# Patient Record
Sex: Female | Born: 1955 | Race: Black or African American | Hispanic: No | Marital: Single | Smoking: Never smoker
Health system: Southern US, Community
[De-identification: ages and names within clinical notes are randomized; demographics above are authoritative.]

## PROBLEM LIST (undated history)

## (undated) DIAGNOSIS — R51 Headache: Secondary | ICD-10-CM

## (undated) DIAGNOSIS — K219 Gastro-esophageal reflux disease without esophagitis: Secondary | ICD-10-CM

## (undated) DIAGNOSIS — F419 Anxiety disorder, unspecified: Secondary | ICD-10-CM

## (undated) DIAGNOSIS — E119 Type 2 diabetes mellitus without complications: Secondary | ICD-10-CM

## (undated) DIAGNOSIS — G4733 Obstructive sleep apnea (adult) (pediatric): Secondary | ICD-10-CM

## (undated) DIAGNOSIS — J45909 Unspecified asthma, uncomplicated: Secondary | ICD-10-CM

## (undated) DIAGNOSIS — R519 Headache, unspecified: Secondary | ICD-10-CM

## (undated) DIAGNOSIS — G8929 Other chronic pain: Secondary | ICD-10-CM

## (undated) DIAGNOSIS — D869 Sarcoidosis, unspecified: Secondary | ICD-10-CM

## (undated) DIAGNOSIS — K635 Polyp of colon: Secondary | ICD-10-CM

## (undated) DIAGNOSIS — E785 Hyperlipidemia, unspecified: Secondary | ICD-10-CM

## (undated) HISTORY — DX: Unspecified asthma, uncomplicated: J45.909

## (undated) HISTORY — DX: Headache, unspecified: R51.9

## (undated) HISTORY — DX: Type 2 diabetes mellitus without complications: E11.9

## (undated) HISTORY — DX: Gastro-esophageal reflux disease without esophagitis: K21.9

## (undated) HISTORY — DX: Other chronic pain: G89.29

## (undated) HISTORY — DX: Polyp of colon: K63.5

## (undated) HISTORY — DX: Sarcoidosis, unspecified: D86.9

## (undated) HISTORY — DX: Anxiety disorder, unspecified: F41.9

## (undated) HISTORY — PX: ABDOMINAL HYSTERECTOMY: SHX81

## (undated) HISTORY — DX: Obstructive sleep apnea (adult) (pediatric): G47.33

## (undated) HISTORY — DX: Hyperlipidemia, unspecified: E78.5

## (undated) HISTORY — DX: Headache: R51

---

## 1976-08-19 HISTORY — PX: TUBAL LIGATION: SHX77

## 1990-08-19 HISTORY — PX: VESICOVAGINAL FISTULA CLOSURE W/ TAH: SUR271

## 1995-08-20 HISTORY — PX: CHOLECYSTECTOMY: SHX55

## 1996-08-19 HISTORY — PX: LUNG SURGERY: SHX703

## 2011-08-28 DIAGNOSIS — J45909 Unspecified asthma, uncomplicated: Secondary | ICD-10-CM | POA: Diagnosis not present

## 2011-08-28 DIAGNOSIS — G4733 Obstructive sleep apnea (adult) (pediatric): Secondary | ICD-10-CM | POA: Diagnosis not present

## 2011-08-28 DIAGNOSIS — J99 Respiratory disorders in diseases classified elsewhere: Secondary | ICD-10-CM | POA: Diagnosis not present

## 2011-09-02 DIAGNOSIS — E119 Type 2 diabetes mellitus without complications: Secondary | ICD-10-CM | POA: Diagnosis not present

## 2011-09-02 DIAGNOSIS — J329 Chronic sinusitis, unspecified: Secondary | ICD-10-CM | POA: Diagnosis not present

## 2011-09-02 DIAGNOSIS — I10 Essential (primary) hypertension: Secondary | ICD-10-CM | POA: Diagnosis not present

## 2011-09-17 DIAGNOSIS — R498 Other voice and resonance disorders: Secondary | ICD-10-CM | POA: Diagnosis not present

## 2011-09-17 DIAGNOSIS — H9209 Otalgia, unspecified ear: Secondary | ICD-10-CM | POA: Diagnosis not present

## 2011-10-09 DIAGNOSIS — E785 Hyperlipidemia, unspecified: Secondary | ICD-10-CM | POA: Diagnosis not present

## 2011-10-09 DIAGNOSIS — E119 Type 2 diabetes mellitus without complications: Secondary | ICD-10-CM | POA: Diagnosis not present

## 2011-12-04 DIAGNOSIS — G5600 Carpal tunnel syndrome, unspecified upper limb: Secondary | ICD-10-CM | POA: Diagnosis not present

## 2011-12-04 DIAGNOSIS — M129 Arthropathy, unspecified: Secondary | ICD-10-CM | POA: Diagnosis not present

## 2011-12-04 DIAGNOSIS — M79609 Pain in unspecified limb: Secondary | ICD-10-CM | POA: Diagnosis not present

## 2011-12-04 DIAGNOSIS — E119 Type 2 diabetes mellitus without complications: Secondary | ICD-10-CM | POA: Diagnosis not present

## 2011-12-10 DIAGNOSIS — M722 Plantar fascial fibromatosis: Secondary | ICD-10-CM | POA: Diagnosis not present

## 2011-12-17 DIAGNOSIS — M722 Plantar fascial fibromatosis: Secondary | ICD-10-CM | POA: Diagnosis not present

## 2011-12-25 DIAGNOSIS — E119 Type 2 diabetes mellitus without complications: Secondary | ICD-10-CM | POA: Diagnosis not present

## 2011-12-28 DIAGNOSIS — IMO0002 Reserved for concepts with insufficient information to code with codable children: Secondary | ICD-10-CM | POA: Diagnosis not present

## 2011-12-28 DIAGNOSIS — M4310 Spondylolisthesis, site unspecified: Secondary | ICD-10-CM | POA: Diagnosis not present

## 2011-12-28 DIAGNOSIS — M542 Cervicalgia: Secondary | ICD-10-CM | POA: Diagnosis not present

## 2011-12-28 DIAGNOSIS — M62838 Other muscle spasm: Secondary | ICD-10-CM | POA: Diagnosis not present

## 2011-12-28 DIAGNOSIS — M5030 Other cervical disc degeneration, unspecified cervical region: Secondary | ICD-10-CM | POA: Diagnosis not present

## 2011-12-28 DIAGNOSIS — M47812 Spondylosis without myelopathy or radiculopathy, cervical region: Secondary | ICD-10-CM | POA: Diagnosis not present

## 2011-12-28 DIAGNOSIS — G8911 Acute pain due to trauma: Secondary | ICD-10-CM | POA: Diagnosis not present

## 2011-12-28 DIAGNOSIS — M25519 Pain in unspecified shoulder: Secondary | ICD-10-CM | POA: Diagnosis not present

## 2011-12-31 DIAGNOSIS — R5383 Other fatigue: Secondary | ICD-10-CM | POA: Diagnosis not present

## 2011-12-31 DIAGNOSIS — E119 Type 2 diabetes mellitus without complications: Secondary | ICD-10-CM | POA: Diagnosis not present

## 2011-12-31 DIAGNOSIS — I10 Essential (primary) hypertension: Secondary | ICD-10-CM | POA: Diagnosis not present

## 2011-12-31 DIAGNOSIS — M5030 Other cervical disc degeneration, unspecified cervical region: Secondary | ICD-10-CM | POA: Diagnosis not present

## 2012-01-30 DIAGNOSIS — I10 Essential (primary) hypertension: Secondary | ICD-10-CM | POA: Diagnosis not present

## 2012-01-30 DIAGNOSIS — M5030 Other cervical disc degeneration, unspecified cervical region: Secondary | ICD-10-CM | POA: Diagnosis not present

## 2012-01-30 DIAGNOSIS — F329 Major depressive disorder, single episode, unspecified: Secondary | ICD-10-CM | POA: Diagnosis not present

## 2012-01-30 DIAGNOSIS — F3289 Other specified depressive episodes: Secondary | ICD-10-CM | POA: Diagnosis not present

## 2012-01-30 DIAGNOSIS — E119 Type 2 diabetes mellitus without complications: Secondary | ICD-10-CM | POA: Diagnosis not present

## 2012-02-13 DIAGNOSIS — E119 Type 2 diabetes mellitus without complications: Secondary | ICD-10-CM | POA: Diagnosis not present

## 2012-03-03 DIAGNOSIS — G4733 Obstructive sleep apnea (adult) (pediatric): Secondary | ICD-10-CM | POA: Diagnosis not present

## 2012-03-03 DIAGNOSIS — J45909 Unspecified asthma, uncomplicated: Secondary | ICD-10-CM | POA: Diagnosis not present

## 2012-03-03 DIAGNOSIS — J99 Respiratory disorders in diseases classified elsewhere: Secondary | ICD-10-CM | POA: Diagnosis not present

## 2012-03-03 DIAGNOSIS — D869 Sarcoidosis, unspecified: Secondary | ICD-10-CM | POA: Diagnosis not present

## 2012-03-16 DIAGNOSIS — B356 Tinea cruris: Secondary | ICD-10-CM | POA: Diagnosis not present

## 2012-03-16 DIAGNOSIS — I10 Essential (primary) hypertension: Secondary | ICD-10-CM | POA: Diagnosis not present

## 2012-03-16 DIAGNOSIS — M5030 Other cervical disc degeneration, unspecified cervical region: Secondary | ICD-10-CM | POA: Diagnosis not present

## 2012-03-16 DIAGNOSIS — E119 Type 2 diabetes mellitus without complications: Secondary | ICD-10-CM | POA: Diagnosis not present

## 2012-04-06 DIAGNOSIS — Z1231 Encounter for screening mammogram for malignant neoplasm of breast: Secondary | ICD-10-CM | POA: Diagnosis not present

## 2012-04-29 DIAGNOSIS — L821 Other seborrheic keratosis: Secondary | ICD-10-CM | POA: Diagnosis not present

## 2012-04-29 DIAGNOSIS — L909 Atrophic disorder of skin, unspecified: Secondary | ICD-10-CM | POA: Diagnosis not present

## 2012-04-29 DIAGNOSIS — L919 Hypertrophic disorder of the skin, unspecified: Secondary | ICD-10-CM | POA: Diagnosis not present

## 2012-05-06 DIAGNOSIS — H9209 Otalgia, unspecified ear: Secondary | ICD-10-CM | POA: Diagnosis not present

## 2012-05-18 DIAGNOSIS — I10 Essential (primary) hypertension: Secondary | ICD-10-CM | POA: Diagnosis not present

## 2012-05-18 DIAGNOSIS — M545 Low back pain: Secondary | ICD-10-CM | POA: Diagnosis not present

## 2012-05-18 DIAGNOSIS — J329 Chronic sinusitis, unspecified: Secondary | ICD-10-CM | POA: Diagnosis not present

## 2012-05-18 DIAGNOSIS — E119 Type 2 diabetes mellitus without complications: Secondary | ICD-10-CM | POA: Diagnosis not present

## 2012-05-20 DIAGNOSIS — R221 Localized swelling, mass and lump, neck: Secondary | ICD-10-CM | POA: Diagnosis not present

## 2012-05-20 DIAGNOSIS — R220 Localized swelling, mass and lump, head: Secondary | ICD-10-CM | POA: Diagnosis not present

## 2012-05-20 DIAGNOSIS — M542 Cervicalgia: Secondary | ICD-10-CM | POA: Diagnosis not present

## 2012-05-25 DIAGNOSIS — L0201 Cutaneous abscess of face: Secondary | ICD-10-CM | POA: Diagnosis not present

## 2012-05-25 DIAGNOSIS — R109 Unspecified abdominal pain: Secondary | ICD-10-CM | POA: Diagnosis not present

## 2012-05-25 DIAGNOSIS — E119 Type 2 diabetes mellitus without complications: Secondary | ICD-10-CM | POA: Diagnosis not present

## 2012-05-25 DIAGNOSIS — L03211 Cellulitis of face: Secondary | ICD-10-CM | POA: Diagnosis not present

## 2012-05-25 DIAGNOSIS — I10 Essential (primary) hypertension: Secondary | ICD-10-CM | POA: Diagnosis not present

## 2012-05-25 DIAGNOSIS — R5383 Other fatigue: Secondary | ICD-10-CM | POA: Diagnosis not present

## 2012-05-25 DIAGNOSIS — R5381 Other malaise: Secondary | ICD-10-CM | POA: Diagnosis not present

## 2012-05-25 DIAGNOSIS — H9209 Otalgia, unspecified ear: Secondary | ICD-10-CM | POA: Diagnosis not present

## 2012-07-06 DIAGNOSIS — M542 Cervicalgia: Secondary | ICD-10-CM | POA: Diagnosis not present

## 2012-07-06 DIAGNOSIS — M436 Torticollis: Secondary | ICD-10-CM | POA: Diagnosis not present

## 2012-07-07 DIAGNOSIS — J45909 Unspecified asthma, uncomplicated: Secondary | ICD-10-CM | POA: Diagnosis not present

## 2012-07-07 DIAGNOSIS — M542 Cervicalgia: Secondary | ICD-10-CM | POA: Diagnosis not present

## 2012-07-07 DIAGNOSIS — E119 Type 2 diabetes mellitus without complications: Secondary | ICD-10-CM | POA: Diagnosis not present

## 2012-07-07 DIAGNOSIS — D869 Sarcoidosis, unspecified: Secondary | ICD-10-CM | POA: Diagnosis not present

## 2012-07-07 DIAGNOSIS — Z794 Long term (current) use of insulin: Secondary | ICD-10-CM | POA: Diagnosis not present

## 2012-07-07 DIAGNOSIS — S139XXA Sprain of joints and ligaments of unspecified parts of neck, initial encounter: Secondary | ICD-10-CM | POA: Diagnosis not present

## 2012-07-07 DIAGNOSIS — I10 Essential (primary) hypertension: Secondary | ICD-10-CM | POA: Diagnosis not present

## 2012-07-07 DIAGNOSIS — Z7982 Long term (current) use of aspirin: Secondary | ICD-10-CM | POA: Diagnosis not present

## 2012-07-23 DIAGNOSIS — I10 Essential (primary) hypertension: Secondary | ICD-10-CM | POA: Diagnosis not present

## 2012-07-23 DIAGNOSIS — R5381 Other malaise: Secondary | ICD-10-CM | POA: Diagnosis not present

## 2012-07-23 DIAGNOSIS — R5383 Other fatigue: Secondary | ICD-10-CM | POA: Diagnosis not present

## 2012-07-23 DIAGNOSIS — D869 Sarcoidosis, unspecified: Secondary | ICD-10-CM | POA: Diagnosis not present

## 2012-07-23 DIAGNOSIS — M5030 Other cervical disc degeneration, unspecified cervical region: Secondary | ICD-10-CM | POA: Diagnosis not present

## 2012-09-11 DIAGNOSIS — IMO0002 Reserved for concepts with insufficient information to code with codable children: Secondary | ICD-10-CM | POA: Diagnosis not present

## 2012-09-24 DIAGNOSIS — R1012 Left upper quadrant pain: Secondary | ICD-10-CM | POA: Diagnosis not present

## 2012-09-24 DIAGNOSIS — E119 Type 2 diabetes mellitus without complications: Secondary | ICD-10-CM | POA: Diagnosis not present

## 2012-09-24 DIAGNOSIS — R5383 Other fatigue: Secondary | ICD-10-CM | POA: Diagnosis not present

## 2012-09-24 DIAGNOSIS — E782 Mixed hyperlipidemia: Secondary | ICD-10-CM | POA: Diagnosis not present

## 2012-09-24 DIAGNOSIS — R5381 Other malaise: Secondary | ICD-10-CM | POA: Diagnosis not present

## 2012-09-24 DIAGNOSIS — M542 Cervicalgia: Secondary | ICD-10-CM | POA: Diagnosis not present

## 2012-10-21 DIAGNOSIS — Z1211 Encounter for screening for malignant neoplasm of colon: Secondary | ICD-10-CM | POA: Diagnosis not present

## 2012-10-21 DIAGNOSIS — Z8371 Family history of colonic polyps: Secondary | ICD-10-CM | POA: Diagnosis not present

## 2012-10-22 DIAGNOSIS — R1013 Epigastric pain: Secondary | ICD-10-CM | POA: Diagnosis not present

## 2012-10-22 DIAGNOSIS — K2980 Duodenitis without bleeding: Secondary | ICD-10-CM | POA: Diagnosis not present

## 2012-10-22 DIAGNOSIS — K2900 Acute gastritis without bleeding: Secondary | ICD-10-CM | POA: Diagnosis not present

## 2012-10-23 DIAGNOSIS — R5381 Other malaise: Secondary | ICD-10-CM | POA: Diagnosis not present

## 2012-10-23 DIAGNOSIS — K5732 Diverticulitis of large intestine without perforation or abscess without bleeding: Secondary | ICD-10-CM | POA: Diagnosis not present

## 2012-10-23 DIAGNOSIS — R300 Dysuria: Secondary | ICD-10-CM | POA: Diagnosis not present

## 2012-11-13 DIAGNOSIS — R5381 Other malaise: Secondary | ICD-10-CM | POA: Diagnosis not present

## 2012-11-13 DIAGNOSIS — S93409A Sprain of unspecified ligament of unspecified ankle, initial encounter: Secondary | ICD-10-CM | POA: Diagnosis not present

## 2012-11-13 DIAGNOSIS — E119 Type 2 diabetes mellitus without complications: Secondary | ICD-10-CM | POA: Diagnosis not present

## 2012-11-13 DIAGNOSIS — K219 Gastro-esophageal reflux disease without esophagitis: Secondary | ICD-10-CM | POA: Diagnosis not present

## 2012-12-07 DIAGNOSIS — M2550 Pain in unspecified joint: Secondary | ICD-10-CM | POA: Diagnosis not present

## 2012-12-07 DIAGNOSIS — B370 Candidal stomatitis: Secondary | ICD-10-CM | POA: Diagnosis not present

## 2012-12-07 DIAGNOSIS — L989 Disorder of the skin and subcutaneous tissue, unspecified: Secondary | ICD-10-CM | POA: Diagnosis not present

## 2012-12-07 DIAGNOSIS — N390 Urinary tract infection, site not specified: Secondary | ICD-10-CM | POA: Diagnosis not present

## 2012-12-09 DIAGNOSIS — Z794 Long term (current) use of insulin: Secondary | ICD-10-CM | POA: Diagnosis not present

## 2012-12-09 DIAGNOSIS — R05 Cough: Secondary | ICD-10-CM | POA: Diagnosis not present

## 2012-12-09 DIAGNOSIS — R059 Cough, unspecified: Secondary | ICD-10-CM | POA: Diagnosis not present

## 2012-12-09 DIAGNOSIS — N8111 Cystocele, midline: Secondary | ICD-10-CM | POA: Diagnosis not present

## 2012-12-09 DIAGNOSIS — Z79899 Other long term (current) drug therapy: Secondary | ICD-10-CM | POA: Diagnosis not present

## 2012-12-09 DIAGNOSIS — E119 Type 2 diabetes mellitus without complications: Secondary | ICD-10-CM | POA: Diagnosis not present

## 2012-12-09 DIAGNOSIS — M545 Low back pain: Secondary | ICD-10-CM | POA: Diagnosis not present

## 2012-12-09 DIAGNOSIS — N390 Urinary tract infection, site not specified: Secondary | ICD-10-CM | POA: Diagnosis not present

## 2012-12-09 DIAGNOSIS — R32 Unspecified urinary incontinence: Secondary | ICD-10-CM | POA: Diagnosis not present

## 2012-12-11 DIAGNOSIS — M549 Dorsalgia, unspecified: Secondary | ICD-10-CM | POA: Diagnosis not present

## 2012-12-11 DIAGNOSIS — R300 Dysuria: Secondary | ICD-10-CM | POA: Diagnosis not present

## 2012-12-11 DIAGNOSIS — N8111 Cystocele, midline: Secondary | ICD-10-CM | POA: Diagnosis not present

## 2012-12-11 DIAGNOSIS — N3946 Mixed incontinence: Secondary | ICD-10-CM | POA: Diagnosis not present

## 2012-12-21 DIAGNOSIS — N390 Urinary tract infection, site not specified: Secondary | ICD-10-CM | POA: Diagnosis not present

## 2012-12-21 DIAGNOSIS — N393 Stress incontinence (female) (male): Secondary | ICD-10-CM | POA: Diagnosis not present

## 2012-12-28 DIAGNOSIS — R059 Cough, unspecified: Secondary | ICD-10-CM | POA: Diagnosis not present

## 2012-12-28 DIAGNOSIS — Z111 Encounter for screening for respiratory tuberculosis: Secondary | ICD-10-CM | POA: Diagnosis not present

## 2012-12-28 DIAGNOSIS — R05 Cough: Secondary | ICD-10-CM | POA: Diagnosis not present

## 2012-12-28 DIAGNOSIS — N390 Urinary tract infection, site not specified: Secondary | ICD-10-CM | POA: Diagnosis not present

## 2012-12-28 DIAGNOSIS — E785 Hyperlipidemia, unspecified: Secondary | ICD-10-CM | POA: Diagnosis not present

## 2012-12-30 DIAGNOSIS — Z111 Encounter for screening for respiratory tuberculosis: Secondary | ICD-10-CM | POA: Diagnosis not present

## 2013-02-02 DIAGNOSIS — R05 Cough: Secondary | ICD-10-CM | POA: Diagnosis not present

## 2013-02-02 DIAGNOSIS — J45909 Unspecified asthma, uncomplicated: Secondary | ICD-10-CM | POA: Diagnosis not present

## 2013-02-02 DIAGNOSIS — G4733 Obstructive sleep apnea (adult) (pediatric): Secondary | ICD-10-CM | POA: Diagnosis not present

## 2013-02-02 DIAGNOSIS — J3089 Other allergic rhinitis: Secondary | ICD-10-CM | POA: Diagnosis not present

## 2013-02-10 DIAGNOSIS — R0789 Other chest pain: Secondary | ICD-10-CM | POA: Diagnosis not present

## 2013-02-10 DIAGNOSIS — D869 Sarcoidosis, unspecified: Secondary | ICD-10-CM | POA: Diagnosis not present

## 2013-02-10 DIAGNOSIS — E119 Type 2 diabetes mellitus without complications: Secondary | ICD-10-CM | POA: Diagnosis not present

## 2013-02-10 DIAGNOSIS — I10 Essential (primary) hypertension: Secondary | ICD-10-CM | POA: Diagnosis not present

## 2013-02-23 DIAGNOSIS — D649 Anemia, unspecified: Secondary | ICD-10-CM | POA: Diagnosis not present

## 2013-03-04 DIAGNOSIS — K219 Gastro-esophageal reflux disease without esophagitis: Secondary | ICD-10-CM | POA: Diagnosis not present

## 2013-03-04 DIAGNOSIS — J45909 Unspecified asthma, uncomplicated: Secondary | ICD-10-CM | POA: Diagnosis not present

## 2013-03-04 DIAGNOSIS — G4733 Obstructive sleep apnea (adult) (pediatric): Secondary | ICD-10-CM | POA: Diagnosis not present

## 2013-03-04 DIAGNOSIS — R05 Cough: Secondary | ICD-10-CM | POA: Diagnosis not present

## 2013-03-12 DIAGNOSIS — M545 Low back pain: Secondary | ICD-10-CM | POA: Diagnosis not present

## 2013-03-12 DIAGNOSIS — D869 Sarcoidosis, unspecified: Secondary | ICD-10-CM | POA: Diagnosis not present

## 2013-03-12 DIAGNOSIS — K5900 Constipation, unspecified: Secondary | ICD-10-CM | POA: Diagnosis not present

## 2013-03-12 DIAGNOSIS — E119 Type 2 diabetes mellitus without complications: Secondary | ICD-10-CM | POA: Diagnosis not present

## 2013-04-13 DIAGNOSIS — D869 Sarcoidosis, unspecified: Secondary | ICD-10-CM | POA: Diagnosis not present

## 2013-04-13 DIAGNOSIS — E119 Type 2 diabetes mellitus without complications: Secondary | ICD-10-CM | POA: Diagnosis not present

## 2013-04-13 DIAGNOSIS — M5030 Other cervical disc degeneration, unspecified cervical region: Secondary | ICD-10-CM | POA: Diagnosis not present

## 2013-04-13 DIAGNOSIS — I10 Essential (primary) hypertension: Secondary | ICD-10-CM | POA: Diagnosis not present

## 2013-04-13 DIAGNOSIS — H524 Presbyopia: Secondary | ICD-10-CM | POA: Diagnosis not present

## 2013-04-13 DIAGNOSIS — H30039 Focal chorioretinal inflammation, peripheral, unspecified eye: Secondary | ICD-10-CM | POA: Diagnosis not present

## 2013-05-10 DIAGNOSIS — M5380 Other specified dorsopathies, site unspecified: Secondary | ICD-10-CM | POA: Diagnosis not present

## 2013-05-10 DIAGNOSIS — R110 Nausea: Secondary | ICD-10-CM | POA: Diagnosis not present

## 2013-05-10 DIAGNOSIS — M5030 Other cervical disc degeneration, unspecified cervical region: Secondary | ICD-10-CM | POA: Diagnosis not present

## 2013-06-02 DIAGNOSIS — B379 Candidiasis, unspecified: Secondary | ICD-10-CM | POA: Diagnosis not present

## 2013-06-02 DIAGNOSIS — E1169 Type 2 diabetes mellitus with other specified complication: Secondary | ICD-10-CM | POA: Diagnosis not present

## 2013-06-11 DIAGNOSIS — I10 Essential (primary) hypertension: Secondary | ICD-10-CM | POA: Diagnosis not present

## 2013-06-11 DIAGNOSIS — L0291 Cutaneous abscess, unspecified: Secondary | ICD-10-CM | POA: Diagnosis not present

## 2013-06-11 DIAGNOSIS — E119 Type 2 diabetes mellitus without complications: Secondary | ICD-10-CM | POA: Diagnosis not present

## 2013-06-11 DIAGNOSIS — D869 Sarcoidosis, unspecified: Secondary | ICD-10-CM | POA: Diagnosis not present

## 2013-06-14 DIAGNOSIS — Z23 Encounter for immunization: Secondary | ICD-10-CM | POA: Diagnosis not present

## 2013-06-14 DIAGNOSIS — E785 Hyperlipidemia, unspecified: Secondary | ICD-10-CM | POA: Diagnosis not present

## 2013-06-14 DIAGNOSIS — E7800 Pure hypercholesterolemia, unspecified: Secondary | ICD-10-CM | POA: Diagnosis not present

## 2013-06-14 DIAGNOSIS — E119 Type 2 diabetes mellitus without complications: Secondary | ICD-10-CM | POA: Diagnosis not present

## 2013-06-14 DIAGNOSIS — I10 Essential (primary) hypertension: Secondary | ICD-10-CM | POA: Diagnosis not present

## 2013-06-17 DIAGNOSIS — Z1231 Encounter for screening mammogram for malignant neoplasm of breast: Secondary | ICD-10-CM | POA: Diagnosis not present

## 2013-06-28 DIAGNOSIS — L98499 Non-pressure chronic ulcer of skin of other sites with unspecified severity: Secondary | ICD-10-CM | POA: Diagnosis not present

## 2013-06-28 DIAGNOSIS — D869 Sarcoidosis, unspecified: Secondary | ICD-10-CM | POA: Diagnosis not present

## 2013-06-28 DIAGNOSIS — D492 Neoplasm of unspecified behavior of bone, soft tissue, and skin: Secondary | ICD-10-CM | POA: Diagnosis not present

## 2013-07-10 DIAGNOSIS — Z885 Allergy status to narcotic agent status: Secondary | ICD-10-CM | POA: Diagnosis not present

## 2013-07-10 DIAGNOSIS — D869 Sarcoidosis, unspecified: Secondary | ICD-10-CM | POA: Diagnosis not present

## 2013-07-10 DIAGNOSIS — E119 Type 2 diabetes mellitus without complications: Secondary | ICD-10-CM | POA: Diagnosis not present

## 2013-07-10 DIAGNOSIS — Z794 Long term (current) use of insulin: Secondary | ICD-10-CM | POA: Diagnosis not present

## 2013-07-10 DIAGNOSIS — L988 Other specified disorders of the skin and subcutaneous tissue: Secondary | ICD-10-CM | POA: Diagnosis not present

## 2013-07-10 DIAGNOSIS — Z4802 Encounter for removal of sutures: Secondary | ICD-10-CM | POA: Diagnosis not present

## 2013-07-10 DIAGNOSIS — M329 Systemic lupus erythematosus, unspecified: Secondary | ICD-10-CM | POA: Diagnosis not present

## 2013-07-10 DIAGNOSIS — Z7982 Long term (current) use of aspirin: Secondary | ICD-10-CM | POA: Diagnosis not present

## 2013-07-10 DIAGNOSIS — K219 Gastro-esophageal reflux disease without esophagitis: Secondary | ICD-10-CM | POA: Diagnosis not present

## 2013-07-10 DIAGNOSIS — J45909 Unspecified asthma, uncomplicated: Secondary | ICD-10-CM | POA: Diagnosis not present

## 2013-07-10 DIAGNOSIS — L989 Disorder of the skin and subcutaneous tissue, unspecified: Secondary | ICD-10-CM | POA: Diagnosis not present

## 2013-07-13 DIAGNOSIS — L98499 Non-pressure chronic ulcer of skin of other sites with unspecified severity: Secondary | ICD-10-CM | POA: Diagnosis not present

## 2013-07-13 DIAGNOSIS — L0889 Other specified local infections of the skin and subcutaneous tissue: Secondary | ICD-10-CM | POA: Diagnosis not present

## 2013-07-19 DIAGNOSIS — D509 Iron deficiency anemia, unspecified: Secondary | ICD-10-CM | POA: Diagnosis not present

## 2013-07-19 DIAGNOSIS — L989 Disorder of the skin and subcutaneous tissue, unspecified: Secondary | ICD-10-CM | POA: Diagnosis not present

## 2013-07-19 DIAGNOSIS — E119 Type 2 diabetes mellitus without complications: Secondary | ICD-10-CM | POA: Diagnosis not present

## 2013-07-19 DIAGNOSIS — M5030 Other cervical disc degeneration, unspecified cervical region: Secondary | ICD-10-CM | POA: Diagnosis not present

## 2013-08-10 DIAGNOSIS — L910 Hypertrophic scar: Secondary | ICD-10-CM | POA: Diagnosis not present

## 2013-08-10 DIAGNOSIS — L83 Acanthosis nigricans: Secondary | ICD-10-CM | POA: Diagnosis not present

## 2013-08-10 DIAGNOSIS — L819 Disorder of pigmentation, unspecified: Secondary | ICD-10-CM | POA: Diagnosis not present

## 2013-09-15 DIAGNOSIS — IMO0001 Reserved for inherently not codable concepts without codable children: Secondary | ICD-10-CM | POA: Diagnosis not present

## 2013-09-15 DIAGNOSIS — G609 Hereditary and idiopathic neuropathy, unspecified: Secondary | ICD-10-CM | POA: Diagnosis not present

## 2013-09-15 DIAGNOSIS — E119 Type 2 diabetes mellitus without complications: Secondary | ICD-10-CM | POA: Diagnosis not present

## 2013-09-15 DIAGNOSIS — E039 Hypothyroidism, unspecified: Secondary | ICD-10-CM | POA: Diagnosis not present

## 2013-09-15 DIAGNOSIS — E785 Hyperlipidemia, unspecified: Secondary | ICD-10-CM | POA: Diagnosis not present

## 2013-09-20 DIAGNOSIS — E119 Type 2 diabetes mellitus without complications: Secondary | ICD-10-CM | POA: Diagnosis not present

## 2013-09-20 DIAGNOSIS — D869 Sarcoidosis, unspecified: Secondary | ICD-10-CM | POA: Diagnosis not present

## 2013-09-21 DIAGNOSIS — E1065 Type 1 diabetes mellitus with hyperglycemia: Secondary | ICD-10-CM | POA: Diagnosis not present

## 2013-09-21 DIAGNOSIS — IMO0002 Reserved for concepts with insufficient information to code with codable children: Secondary | ICD-10-CM | POA: Diagnosis not present

## 2013-10-18 DIAGNOSIS — B379 Candidiasis, unspecified: Secondary | ICD-10-CM | POA: Diagnosis not present

## 2013-10-18 DIAGNOSIS — D869 Sarcoidosis, unspecified: Secondary | ICD-10-CM | POA: Diagnosis not present

## 2013-10-18 DIAGNOSIS — M5030 Other cervical disc degeneration, unspecified cervical region: Secondary | ICD-10-CM | POA: Diagnosis not present

## 2013-10-18 DIAGNOSIS — M6740 Ganglion, unspecified site: Secondary | ICD-10-CM | POA: Diagnosis not present

## 2013-10-22 DIAGNOSIS — K1120 Sialoadenitis, unspecified: Secondary | ICD-10-CM | POA: Diagnosis not present

## 2013-10-22 DIAGNOSIS — E119 Type 2 diabetes mellitus without complications: Secondary | ICD-10-CM | POA: Diagnosis not present

## 2013-10-22 DIAGNOSIS — J209 Acute bronchitis, unspecified: Secondary | ICD-10-CM | POA: Diagnosis not present

## 2013-10-22 DIAGNOSIS — D869 Sarcoidosis, unspecified: Secondary | ICD-10-CM | POA: Diagnosis not present

## 2013-11-18 DIAGNOSIS — D869 Sarcoidosis, unspecified: Secondary | ICD-10-CM | POA: Diagnosis not present

## 2013-11-18 DIAGNOSIS — F329 Major depressive disorder, single episode, unspecified: Secondary | ICD-10-CM | POA: Diagnosis not present

## 2013-11-18 DIAGNOSIS — L919 Hypertrophic disorder of the skin, unspecified: Secondary | ICD-10-CM | POA: Diagnosis not present

## 2013-11-18 DIAGNOSIS — L909 Atrophic disorder of skin, unspecified: Secondary | ICD-10-CM | POA: Diagnosis not present

## 2013-11-18 DIAGNOSIS — F3289 Other specified depressive episodes: Secondary | ICD-10-CM | POA: Diagnosis not present

## 2013-11-18 DIAGNOSIS — F4321 Adjustment disorder with depressed mood: Secondary | ICD-10-CM | POA: Diagnosis not present

## 2014-06-13 DIAGNOSIS — R569 Unspecified convulsions: Secondary | ICD-10-CM | POA: Diagnosis not present

## 2014-06-13 DIAGNOSIS — E784 Other hyperlipidemia: Secondary | ICD-10-CM | POA: Diagnosis not present

## 2014-06-13 DIAGNOSIS — D869 Sarcoidosis, unspecified: Secondary | ICD-10-CM | POA: Diagnosis not present

## 2014-06-13 DIAGNOSIS — E1165 Type 2 diabetes mellitus with hyperglycemia: Secondary | ICD-10-CM | POA: Diagnosis not present

## 2014-07-25 DIAGNOSIS — R569 Unspecified convulsions: Secondary | ICD-10-CM | POA: Diagnosis not present

## 2014-07-25 DIAGNOSIS — I10 Essential (primary) hypertension: Secondary | ICD-10-CM | POA: Diagnosis not present

## 2014-07-25 DIAGNOSIS — D869 Sarcoidosis, unspecified: Secondary | ICD-10-CM | POA: Diagnosis not present

## 2014-07-25 DIAGNOSIS — E784 Other hyperlipidemia: Secondary | ICD-10-CM | POA: Diagnosis not present

## 2014-07-25 DIAGNOSIS — E1165 Type 2 diabetes mellitus with hyperglycemia: Secondary | ICD-10-CM | POA: Diagnosis not present

## 2014-07-28 ENCOUNTER — Institutional Professional Consult (permissible substitution): Payer: Self-pay | Admitting: Internal Medicine

## 2014-08-08 ENCOUNTER — Encounter: Payer: Self-pay | Admitting: Internal Medicine

## 2014-08-08 ENCOUNTER — Ambulatory Visit (INDEPENDENT_AMBULATORY_CARE_PROVIDER_SITE_OTHER): Payer: Medicare Other | Admitting: Internal Medicine

## 2014-08-08 ENCOUNTER — Ambulatory Visit (INDEPENDENT_AMBULATORY_CARE_PROVIDER_SITE_OTHER)
Admission: RE | Admit: 2014-08-08 | Discharge: 2014-08-08 | Disposition: A | Discharge status: Home / Self Care | Payer: Medicare Other | Source: Ambulatory Visit | Attending: Internal Medicine | Admitting: Internal Medicine

## 2014-08-08 ENCOUNTER — Encounter (INDEPENDENT_AMBULATORY_CARE_PROVIDER_SITE_OTHER): Payer: Self-pay

## 2014-08-08 VITALS — BP 122/70 | HR 92 | Ht 62.5 in | Wt 225.0 lb

## 2014-08-08 DIAGNOSIS — R05 Cough: Secondary | ICD-10-CM

## 2014-08-08 DIAGNOSIS — D860 Sarcoidosis of lung: Secondary | ICD-10-CM | POA: Diagnosis not present

## 2014-08-08 DIAGNOSIS — R918 Other nonspecific abnormal finding of lung field: Secondary | ICD-10-CM | POA: Diagnosis not present

## 2014-08-08 DIAGNOSIS — D869 Sarcoidosis, unspecified: Secondary | ICD-10-CM | POA: Diagnosis not present

## 2014-08-08 DIAGNOSIS — R058 Other specified cough: Secondary | ICD-10-CM

## 2014-08-08 DIAGNOSIS — I10 Essential (primary) hypertension: Secondary | ICD-10-CM | POA: Diagnosis not present

## 2014-08-08 MED ORDER — VALSARTAN 160 MG PO TABS: 160.0000 mg | ORAL_TABLET | Freq: Every day | ORAL | Status: DC

## 2014-08-08 MED ORDER — ALBUTEROL SULFATE (2.5 MG/3ML) 0.083% IN NEBU: 2.5000 mg | INHALATION_SOLUTION | Freq: Four times a day (QID) | RESPIRATORY_TRACT | Status: DC | PRN

## 2014-08-08 NOTE — Progress Notes (Signed)
Subjective:    Patient ID: Cathy Stevenson, female    DOB: 12/30/1955,    MRN: [id number]  HPI  23 yobf dx sarcoid in 1980s presenting as cough/sob  requiring "chemo" at one point in the 90s (cytoxan)  and last prednisone 2014 helps but drives up sugar so referred by Avbuerre to pulmonary clinic 08/08/2014 for refractory cough ? Sarcoid related.    08/08/2014 1st [facility] office visit/ Dannell Raczkowski  / on ACEi for ? Decades  Chief Complaint  Patient presents with  . Pulmonary Consult    Referred by Dr. Haskel Schroeder. Pt states that she was dxed with Sarcoid 32 yrs ago  by lung bx. She c/o cough and chest discomfort that has bothered her since she was dxed. Her cough is prod with minimal yellow sputum.  She also c/o DOE for the past 2 wks- with walking short distances and lifting things.   cough is chronic x years, more day than night, and when severe assoc with doe x more than room to room with audible wheezing/ sensation of something stuck in her throat but actually very little mucus production, saba not very helpful  No obvious   patterns in day to day or daytime variabilty or assoc  chest tightness, subjective wheeze overt sinus or hb symptoms. No unusual exp hx or h/o childhood pna/ asthma or knowledge of premature birth.  Sleeping ok without nocturnal  or early am exacerbation  of respiratory  c/o's or need for noct saba. Also denies any obvious fluctuation of symptoms with weather or environmental changes or other aggravating or alleviating factors except as outlined above   Current Medications, Allergies, Complete Past Medical History, Past Surgical History, Family History, and Social History were reviewed in [employer] record.                Review of Systems  Constitutional: Negative for fever, chills and unexpected weight change.  HENT: Negative for congestion, dental problem, ear pain, nosebleeds, postnasal drip, rhinorrhea, sinus pressure,  sneezing, sore throat, trouble swallowing and voice change.   Eyes: Negative for visual disturbance.  Respiratory: Positive for cough and shortness of breath. Negative for choking.   Cardiovascular: Positive for chest pain. Negative for leg swelling.  Gastrointestinal: Negative for vomiting, abdominal pain and diarrhea.  Genitourinary: Negative for difficulty urinating.  Musculoskeletal: Negative for arthralgias.  Skin: Negative for rash.  Neurological: Positive for headaches. Negative for tremors and syncope.  Hematological: Does not bruise/bleed easily.       Objective:   Physical Exam  amb obese bf nad except for mild pseudowheeze  Wt Readings from Last 3 Encounters:  08/08/14 225 lb (102.059 kg)    Vital signs reviewed  HEENT: nl dentition, turbinates, and orophanx. Nl external ear canals without cough reflex   NECK :  without JVD/Nodes/TM/ nl carotid upstrokes bilaterally   LUNGS: no acc muscle use, clear to A and P bilaterally without cough on insp or exp maneuvers   CV:  RRR  no s3 or murmur or increase in P2, no edema   ABD:  soft and nontender with nl excursion in the supine position. No bruits or organomegaly, bowel sounds nl  MS:  warm without deformities, calf tenderness, cyanosis or clubbing  SKIN: warm and dry without lesions    NEURO:  alert, approp, no deficits    cxr 08/08/14 . Cannot exclude mild right hilar and infrahilar adenopathy.  2. Diffuse mild interstitial prominence with mild right lower  lobe infiltrate. These changes could all be related to the patient's sarcoidosis. Active infection pneumonitis and right lower lobe pneumonia cannot be excluded.       Assessment & Plan:

## 2014-08-08 NOTE — Patient Instructions (Addendum)
Stop ramapril and your symptoms should gradually improve over several weeks  Start diovan 160 mg one daily   Ok to use the nebulizer if needed up to 4 hours > breathing/ chest tightness/ asthma/ wheezing (it will last 4 hours)   Change nexium where you take it Take 30- 60 min before your first and last meals of the day  GERD (REFLUX)  is an extremely common cause of respiratory symptoms just like yours , many times with no obvious heartburn at all.    It can be treated with medication, but also with lifestyle changes including avoidance of late meals, excessive alcohol, smoking cessation, and avoid fatty foods, chocolate, peppermint, colas, red wine, and acidic juices such as orange juice.  NO MINT OR MENTHOL PRODUCTS SO NO COUGH DROPS  USE SUGARLESS CANDY INSTEAD (Jolley ranchers or Stover's or Life Savers) or even ice chips will also do - the key is to swallow to prevent all throat clearing. NO OIL BASED VITAMINS - use powdered substitutes.   Please remember to go to the x-ray department downstairs for your tests - we will call you with the results when they are available.     Please schedule a follow up office visit in 4 weeks, sooner if needed wih pfts

## 2014-08-08 NOTE — Progress Notes (Signed)
Quick Note:  ATC, NA and no option to leave msg, WCB ______ 

## 2014-08-09 ENCOUNTER — Encounter: Payer: Self-pay | Admitting: Internal Medicine

## 2014-08-09 DIAGNOSIS — R05 Cough: Secondary | ICD-10-CM | POA: Insufficient documentation

## 2014-08-09 DIAGNOSIS — I10 Essential (primary) hypertension: Secondary | ICD-10-CM | POA: Insufficient documentation

## 2014-08-09 DIAGNOSIS — R058 Other specified cough: Secondary | ICD-10-CM | POA: Insufficient documentation

## 2014-08-09 NOTE — Assessment & Plan Note (Signed)
ACE inhibitors are problematic in  pts with airway complaints because  even experienced pulmonologists can't always distinguish ace effects from copd/asthma.  By themselves they don't actually cause a problem, much like oxygen can't by itself start a fire, but they certainly serve as a powerful catalyst or enhancer for any "fire"  or inflammatory process in the upper airway, be it caused by an ET  tube or more commonly reflux (especially in the obese or pts with known GERD or who are on biphoshonates).    In the era of ARB near equivalency until we have a better handle on the reversibility of the airway problem, it just makes sense to avoid ACEI  entirely in the short run and then decide later, having established a level of airway control using a reasonable limited regimen, whether to add back ace but even then being very careful to observe the pt for worsening airway control and number of meds used/ needed to control symptoms.    For now try diovan 160 mg daily - it's the only way to tell what role acei is having here

## 2014-08-09 NOTE — Assessment & Plan Note (Signed)
No evidence at all for dz activity at present

## 2014-08-09 NOTE — Assessment & Plan Note (Addendum)
The most common causes of chronic cough in immunocompetent adults include the following: upper airway cough syndrome (UACS), previously referred to as postnasal drip syndrome (PNDS), which is caused by variety of rhinosinus conditions; (2) asthma; (3) GERD; (4) chronic bronchitis from cigarette smoking or other inhaled environmental irritants; (5) nonasthmatic eosinophilic bronchitis; and (6) bronchiectasis.   These conditions, singly or in combination, have accounted for up to 94% of the causes of chronic cough in prospective studies.   Other conditions have constituted no >6% of the causes in prospective studies These have included bronchogenic carcinoma, chronic interstitial pneumonia, sarcoidosis, left ventricular failure, ACEI-induced cough, and aspiration from a condition associated with pharyngeal dysfunction.    Chronic cough is often simultaneously caused by more than one condition. A single cause has been found from 38 to 82% of the time, multiple causes from 18 to 62%. Multiply caused cough has been the result of three diseases up to 42% of the time.       Based on hx and exam, this is most likely:  Classic Upper airway cough syndrome, so named because it's frequently impossible to sort out how much is  CR/sinusitis with freq throat clearing (which can be related to primary GERD)   vs  causing  secondary (" extra esophageal")  GERD from wide swings in gastric pressure that occur with throat clearing, often  promoting self use of mint and menthol lozenges that reduce the lower esophageal sphincter tone and exacerbate the problem further in a cyclical fashion.   These are the same pts (now being labeled as having "irritable larynx syndrome" by some cough centers) who not infrequently have a history of having failed to tolerate ace inhibitors,  dry powder inhalers or biphosphonates or report having atypical reflux symptoms that don't respond to standard doses of PPI , and are easily confused as  having aecopd or asthma flares by even experienced allergists/ pulmonologists.   The first step is to stop acei and in short run  maximize acid suppression and eliminate cyclical coughing then regroup in 4 week with pfts   See instructions for specific recommendations which were reviewed directly with the patient who was given a copy with highlighter outlining the key components.

## 2014-08-09 NOTE — Progress Notes (Signed)
Quick Note:  Spoke with pt and notified of results per Dr. Wert. Pt verbalized understanding and denied any questions.  ______ 

## 2014-09-07 ENCOUNTER — Other Ambulatory Visit: Payer: Self-pay | Admitting: Internal Medicine

## 2014-09-07 DIAGNOSIS — R0600 Dyspnea, unspecified: Secondary | ICD-10-CM

## 2014-09-08 ENCOUNTER — Ambulatory Visit: Payer: Medicare Other | Admitting: Internal Medicine

## 2014-09-21 DIAGNOSIS — Z862 Personal history of diseases of the blood and blood-forming organs and certain disorders involving the immune mechanism: Secondary | ICD-10-CM | POA: Diagnosis not present

## 2014-09-21 DIAGNOSIS — H2513 Age-related nuclear cataract, bilateral: Secondary | ICD-10-CM | POA: Diagnosis not present

## 2014-09-21 DIAGNOSIS — E119 Type 2 diabetes mellitus without complications: Secondary | ICD-10-CM | POA: Diagnosis not present

## 2014-09-30 ENCOUNTER — Encounter: Payer: Self-pay | Admitting: *Deleted

## 2014-09-30 ENCOUNTER — Ambulatory Visit (INDEPENDENT_AMBULATORY_CARE_PROVIDER_SITE_OTHER): Payer: Medicare Other | Admitting: Internal Medicine

## 2014-09-30 ENCOUNTER — Encounter: Payer: Self-pay | Admitting: Internal Medicine

## 2014-09-30 VITALS — BP 140/60 | HR 110 | Ht 62.5 in | Wt 222.0 lb

## 2014-09-30 DIAGNOSIS — I10 Essential (primary) hypertension: Secondary | ICD-10-CM | POA: Diagnosis not present

## 2014-09-30 DIAGNOSIS — D869 Sarcoidosis, unspecified: Secondary | ICD-10-CM

## 2014-09-30 DIAGNOSIS — R05 Cough: Secondary | ICD-10-CM | POA: Diagnosis not present

## 2014-09-30 DIAGNOSIS — R058 Other specified cough: Secondary | ICD-10-CM

## 2014-09-30 DIAGNOSIS — R0600 Dyspnea, unspecified: Secondary | ICD-10-CM

## 2014-09-30 LAB — PULMONARY FUNCTION TEST
DL/VA % PRED: 101 %
DL/VA: 4.66 ml/min/mmHg/L
DLCO UNC % PRED: 65 %
DLCO unc: 14.48 ml/min/mmHg
FEF 25-75 Post: 1.47 L/sec
FEF 25-75 Pre: 1.32 L/sec
FEF2575-%Change-Post: 11 %
FEF2575-%PRED-PRE: 68 %
FEF2575-%Pred-Post: 76 %
FEV1-%Change-Post: 0 %
FEV1-%Pred-Post: 83 %
FEV1-%Pred-Pre: 84 %
FEV1-Post: 1.62 L
FEV1-Pre: 1.63 L
FEV1FVC-%Change-Post: 4 %
FEV1FVC-%Pred-Pre: 100 %
FEV6-%CHANGE-POST: -5 %
FEV6-%PRED-PRE: 86 %
FEV6-%Pred-Post: 81 %
FEV6-Post: 1.93 L
FEV6-Pre: 2.05 L
FEV6FVC-%PRED-POST: 104 %
FEV6FVC-%Pred-Pre: 104 %
FVC-%CHANGE-POST: -5 %
FVC-%PRED-POST: 78 %
FVC-%Pred-Pre: 82 %
FVC-Post: 1.94 L
FVC-Pre: 2.05 L
PRE FEV1/FVC RATIO: 80 %
Post FEV1/FVC ratio: 83 %
Post FEV6/FVC ratio: 100 %
Pre FEV6/FVC Ratio: 100 %

## 2014-09-30 MED ORDER — FUROSEMIDE 40 MG PO TABS: 40.0000 mg | ORAL_TABLET | Freq: Every day | ORAL | Status: AC | PRN

## 2014-09-30 MED ORDER — GABAPENTIN 300 MG PO CAPS: 300.0000 mg | ORAL_CAPSULE | Freq: Three times a day (TID) | ORAL | Status: DC

## 2014-09-30 NOTE — Progress Notes (Signed)
PFT performed today. 

## 2014-09-30 NOTE — Progress Notes (Signed)
Subjective:    Patient ID: Cathy Stevenson, female    DOB: [birth year],    MRN: [id number]    Brief patient profile:  89 yobf dx sarcoid in 1980s presenting as cough/sob  requiring "chemo" at one point in the 90s (cytoxan)  and last prednisone 2014 helps but drives up sugar so referred by Avbuerre to pulmonary clinic 08/08/2014 for refractory cough ? Sarcoid related.    History of Present Illness  08/08/2014 1st [facility] office visit/ Vana Arif  / on ACEi for ? Decades  Chief Complaint  Patient presents with  . Pulmonary Consult    Referred by Dr. Haskel Schroeder. Pt states that she was dxed with Sarcoid 32 yrs ago  by lung bx. She c/o cough and chest discomfort that has bothered her since she was dxed. Her cough is prod with minimal yellow sputum.  She also c/o DOE for the past 2 wks- with walking short distances and lifting things.   cough is chronic x years, more day than night, and when severe assoc with doe x more than room to room with audible wheezing/ sensation of something stuck in her throat but actually very little mucus production, saba not very helpful rec Stop ramapril and your symptoms should gradually improve over several weeks Start diovan 160 mg one daily  Ok to use the nebulizer if needed up to 4 hours > breathing/ chest tightness/ asthma/ wheezing (it will last 4 hours)  Change nexium where you take it Take 30- 60 min before your first and last meals of the day GERD diet  Please remember to go to the x-ray department   09/30/2014 f/u ov/Lorell Thibodaux re: ? Sarcoid  Chief Complaint  Patient presents with  . Follow-up    PFT done today. Pt states that her cough and SOB are unchanged since the last visit. She has not needed albuterol neb or inhaler. No new co's today.   on nexium 20 mg bid but not taking ac as rec  slt swelling when upright since change to diovan controls with prn lasix  No obvious day to day or daytime variabilty or assoc chronic cough or cp or chest  tightness, subjective wheeze overt sinus or hb symptoms. No unusual exp hx or h/o childhood pna/ asthma or knowledge of premature birth.  Sleeping ok without nocturnal  or early am exacerbation  of respiratory  c/o's or need for noct saba. Also denies any obvious fluctuation of symptoms with weather or environmental changes or other aggravating or alleviating factors except as outlined above   Current Medications, Allergies, Complete Past Medical History, Past Surgical History, Family History, and Social History were reviewed in [employer] record.  ROS  The following are not active complaints unless bolded sore throat, dysphagia, dental problems, itching, sneezing,  nasal congestion or excess/ purulent secretions, ear ache,   fever, chills, sweats, unintended wt loss, pleuritic or exertional cp, hemoptysis,  orthopnea pnd or leg swelling, presyncope, palpitations, heartburn, abdominal pain, anorexia, nausea, vomiting, diarrhea  or change in bowel or urinary habits, change in stools or urine, dysuria,hematuria,  rash, arthralgias, visual complaints, headache, numbness weakness or ataxia or problems with walking or coordination,  change in mood/affect or memory.                             Objective:   Physical Exam  amb obese bf nad   Wt Readings from Last 3 Encounters:  09/30/14 222 lb (100.699 kg)  08/08/14 225 lb (102.059 kg)    Vital signs reviewed    HEENT: nl dentition, turbinates, and orophanx. Nl external ear canals without cough reflex   NECK :  without JVD/Nodes/TM/ nl carotid upstrokes bilaterally   LUNGS: no acc muscle use, clear to A and P bilaterally without cough on insp or exp maneuvers   CV:  RRR  no s3 or murmur or increase in P2, no edema   ABD:  soft and nontender with nl excursion in the supine position. No bruits or organomegaly, bowel sounds nl  MS:  warm without deformities, calf tenderness, cyanosis or clubbing  SKIN:  warm and dry without lesions    NEURO:  alert, approp, no deficits    CXR:  08/08/14  I personally reviewed images and agree with radiology impression as follows:   1.Cannot exclude mild right hilar and infrahilar adenopathy. 2. Diffuse mild interstitial prominence with mild right lower lobe infiltrate. These changes could all be related to the patient's sarcoidosis. Active infection pneumonitis and right lower lobe pneumonia cannot be excluded.       Assessment & Plan:

## 2014-09-30 NOTE — Patient Instructions (Addendum)
Neurontin 300 mg three times daily with meals  Ok to use lasix 40 mg daily as needed for swelling   Ok to use the nebulizer if needed up to 4 hours > breathing/ chest tightness/ asthma/ wheezing (it will last 4 hours)   Change nexium where you take it Take 30- 60 min before your first and last meals of the day  GERD (REFLUX)  is an extremely common cause of respiratory symptoms just like yours , many times with no obvious heartburn at all.    It can be treated with medication, but also with lifestyle changes including avoidance of late meals, excessive alcohol, smoking cessation, and avoid fatty foods, chocolate, peppermint, colas, red wine, and acidic juices such as orange juice.  NO MINT OR MENTHOL PRODUCTS SO NO COUGH DROPS  USE SUGARLESS CANDY INSTEAD (Jolley ranchers or Stover's or Life Savers) or even ice chips will also do - the key is to swallow to prevent all throat clearing. NO OIL BASED VITAMINS - use powdered substitutes.   Please schedule a follow up office visit in 6 weeks, call sooner if needed bring all medications in hand

## 2014-10-01 ENCOUNTER — Encounter: Payer: Self-pay | Admitting: Internal Medicine

## 2014-10-01 NOTE — Assessment & Plan Note (Addendum)
-   trial off acei 08/09/2014 >  Not convinced better 09/30/14 > rec neurontin 300 tid and max gerd rx  - pfts s airflow obst 09/30/14   I had an extended discussion with the patient reviewing all relevant studies completed to date and  lasting 15 to 20 minutes of a 25 minute visit on the following ongoing concerns:  1) no evidence of a lung problem causing cough here  2) dx of irritable larynx syndrome strongly supported by hx / exam but not better off acei / rec max acid suppression and trial of neurontin  300 tid if tolerated     Each maintenance medication was reviewed in detail including most importantly the difference between maintenance and as needed and under what circumstances the prns are to be used.  Please see instructions for details which were reviewed in writing and the patient given a copy.

## 2014-10-01 NOTE — Assessment & Plan Note (Signed)
Tried  off acei 08/09/2014 due to pseudowheeze   Although doesn't think she's doing better, then pseudowheeze has resolved  Given the non-specific nature of her "respiratory" complaints, I would avoid using acei here indefinitely and rec continue diovan and prn lasix for swelling with option of changing to diovan/hct next f/u with primary care.

## 2014-10-01 NOTE — Assessment & Plan Note (Signed)
-   PFTs 09/30/14  VC 79% pred/ dlco 65 but corrects with alv vol to 101%  Despite cxr/ no evidence at all of active sarcoidso no need for rx

## 2014-10-24 DIAGNOSIS — D869 Sarcoidosis, unspecified: Secondary | ICD-10-CM | POA: Diagnosis not present

## 2014-10-24 DIAGNOSIS — R569 Unspecified convulsions: Secondary | ICD-10-CM | POA: Diagnosis not present

## 2014-10-24 DIAGNOSIS — E784 Other hyperlipidemia: Secondary | ICD-10-CM | POA: Diagnosis not present

## 2014-10-24 DIAGNOSIS — E1165 Type 2 diabetes mellitus with hyperglycemia: Secondary | ICD-10-CM | POA: Diagnosis not present

## 2014-10-24 DIAGNOSIS — I10 Essential (primary) hypertension: Secondary | ICD-10-CM | POA: Diagnosis not present

## 2014-10-24 DIAGNOSIS — B079 Viral wart, unspecified: Secondary | ICD-10-CM | POA: Diagnosis not present

## 2014-11-09 ENCOUNTER — Ambulatory Visit: Payer: Medicare Other | Admitting: Internal Medicine

## 2014-11-29 DIAGNOSIS — B078 Other viral warts: Secondary | ICD-10-CM | POA: Diagnosis not present

## 2014-11-29 DIAGNOSIS — D485 Neoplasm of uncertain behavior of skin: Secondary | ICD-10-CM | POA: Diagnosis not present

## 2014-12-05 DIAGNOSIS — D869 Sarcoidosis, unspecified: Secondary | ICD-10-CM | POA: Diagnosis not present

## 2014-12-05 DIAGNOSIS — K219 Gastro-esophageal reflux disease without esophagitis: Secondary | ICD-10-CM | POA: Diagnosis not present

## 2014-12-05 DIAGNOSIS — Z1239 Encounter for other screening for malignant neoplasm of breast: Secondary | ICD-10-CM | POA: Diagnosis not present

## 2014-12-05 DIAGNOSIS — I10 Essential (primary) hypertension: Secondary | ICD-10-CM | POA: Diagnosis not present

## 2014-12-05 DIAGNOSIS — E1165 Type 2 diabetes mellitus with hyperglycemia: Secondary | ICD-10-CM | POA: Diagnosis not present

## 2014-12-05 DIAGNOSIS — E784 Other hyperlipidemia: Secondary | ICD-10-CM | POA: Diagnosis not present

## 2014-12-28 ENCOUNTER — Emergency Department (HOSPITAL_COMMUNITY): Payer: Medicare Other

## 2014-12-28 ENCOUNTER — Encounter (HOSPITAL_COMMUNITY): Payer: Self-pay | Admitting: Family Medicine

## 2014-12-28 ENCOUNTER — Other Ambulatory Visit: Payer: Self-pay

## 2014-12-28 ENCOUNTER — Emergency Department (HOSPITAL_COMMUNITY)
Admission: EM | Admit: 2014-12-28 | Discharge: 2014-12-28 | Disposition: A | Discharge status: Home / Self Care | Payer: Medicare Other | Attending: Emergency Medicine | Admitting: Emergency Medicine

## 2014-12-28 DIAGNOSIS — R079 Chest pain, unspecified: Secondary | ICD-10-CM

## 2014-12-28 DIAGNOSIS — G8929 Other chronic pain: Secondary | ICD-10-CM | POA: Diagnosis not present

## 2014-12-28 DIAGNOSIS — Y9389 Activity, other specified: Secondary | ICD-10-CM | POA: Diagnosis not present

## 2014-12-28 DIAGNOSIS — S29011A Strain of muscle and tendon of front wall of thorax, initial encounter: Secondary | ICD-10-CM | POA: Insufficient documentation

## 2014-12-28 DIAGNOSIS — Y998 Other external cause status: Secondary | ICD-10-CM | POA: Diagnosis not present

## 2014-12-28 DIAGNOSIS — Z9981 Dependence on supplemental oxygen: Secondary | ICD-10-CM | POA: Diagnosis not present

## 2014-12-28 DIAGNOSIS — E119 Type 2 diabetes mellitus without complications: Secondary | ICD-10-CM | POA: Insufficient documentation

## 2014-12-28 DIAGNOSIS — X58XXXA Exposure to other specified factors, initial encounter: Secondary | ICD-10-CM | POA: Diagnosis not present

## 2014-12-28 DIAGNOSIS — J45901 Unspecified asthma with (acute) exacerbation: Secondary | ICD-10-CM | POA: Diagnosis not present

## 2014-12-28 DIAGNOSIS — G4733 Obstructive sleep apnea (adult) (pediatric): Secondary | ICD-10-CM | POA: Diagnosis not present

## 2014-12-28 DIAGNOSIS — Z79899 Other long term (current) drug therapy: Secondary | ICD-10-CM | POA: Insufficient documentation

## 2014-12-28 DIAGNOSIS — R0789 Other chest pain: Secondary | ICD-10-CM | POA: Diagnosis not present

## 2014-12-28 DIAGNOSIS — Y9289 Other specified places as the place of occurrence of the external cause: Secondary | ICD-10-CM | POA: Insufficient documentation

## 2014-12-28 DIAGNOSIS — Z794 Long term (current) use of insulin: Secondary | ICD-10-CM | POA: Insufficient documentation

## 2014-12-28 DIAGNOSIS — Z862 Personal history of diseases of the blood and blood-forming organs and certain disorders involving the immune mechanism: Secondary | ICD-10-CM | POA: Insufficient documentation

## 2014-12-28 DIAGNOSIS — S299XXA Unspecified injury of thorax, initial encounter: Secondary | ICD-10-CM | POA: Diagnosis present

## 2014-12-28 LAB — CBC
HEMATOCRIT: 34.2 % — AB (ref 36.0–46.0)
Hemoglobin: 10.8 g/dL — ABNORMAL LOW (ref 12.0–15.0)
MCH: 25.8 pg — AB (ref 26.0–34.0)
MCHC: 31.6 g/dL (ref 30.0–36.0)
MCV: 81.8 fL (ref 78.0–100.0)
PLATELETS: 214 10*3/uL (ref 150–400)
RBC: 4.18 MIL/uL (ref 3.87–5.11)
RDW: 13.4 % (ref 11.5–15.5)
WBC: 8.6 10*3/uL (ref 4.0–10.5)

## 2014-12-28 LAB — BASIC METABOLIC PANEL
ANION GAP: 9 (ref 5–15)
BUN: 13 mg/dL (ref 6–20)
CO2: 27 mmol/L (ref 22–32)
Calcium: 10 mg/dL (ref 8.9–10.3)
Chloride: 103 mmol/L (ref 101–111)
Creatinine, Ser: 0.99 mg/dL (ref 0.44–1.00)
Glucose, Bld: 136 mg/dL — ABNORMAL HIGH (ref 70–99)
Potassium: 3.7 mmol/L (ref 3.5–5.1)
SODIUM: 139 mmol/L (ref 135–145)

## 2014-12-28 LAB — BRAIN NATRIURETIC PEPTIDE: B NATRIURETIC PEPTIDE 5: 15.1 pg/mL (ref 0.0–100.0)

## 2014-12-28 LAB — I-STAT TROPONIN, ED: TROPONIN I, POC: 0.02 ng/mL (ref 0.00–0.08)

## 2014-12-28 MED ORDER — CYCLOBENZAPRINE HCL 10 MG PO TABS: 10.0000 mg | ORAL_TABLET | Freq: Two times a day (BID) | ORAL | Status: DC | PRN

## 2014-12-28 MED ORDER — CYCLOBENZAPRINE HCL 10 MG PO TABS
10.0000 mg | ORAL_TABLET | Freq: Once | ORAL | Status: AC
Start: 2014-12-28 — End: 2014-12-28
  Administered 2014-12-28: 10 mg via ORAL
  Filled 2014-12-28: qty 1

## 2014-12-28 MED ORDER — ALBUTEROL SULFATE (2.5 MG/3ML) 0.083% IN NEBU
5.0000 mg | INHALATION_SOLUTION | Freq: Once | RESPIRATORY_TRACT | Status: AC
  Administered 2014-12-28: 5 mg via RESPIRATORY_TRACT
  Filled 2014-12-28: qty 6

## 2014-12-28 MED ORDER — TRAMADOL HCL 50 MG PO TABS
50.0000 mg | ORAL_TABLET | Freq: Once | ORAL | Status: AC
  Administered 2014-12-28: 50 mg via ORAL
  Filled 2014-12-28: qty 1

## 2014-12-28 MED ORDER — IPRATROPIUM BROMIDE 0.02 % IN SOLN
0.5000 mg | Freq: Once | RESPIRATORY_TRACT | Status: DC
  Filled 2014-12-28: qty 2.5

## 2014-12-28 MED ORDER — MELOXICAM 15 MG PO TABS: 15.0000 mg | ORAL_TABLET | Freq: Every day | ORAL | Status: DC

## 2014-12-28 MED ORDER — ALBUTEROL SULFATE (2.5 MG/3ML) 0.083% IN NEBU
5.0000 mg | INHALATION_SOLUTION | Freq: Once | RESPIRATORY_TRACT | Status: DC
  Filled 2014-12-28: qty 6

## 2014-12-28 MED ORDER — PREDNISONE 20 MG PO TABS
60.0000 mg | ORAL_TABLET | Freq: Once | ORAL | Status: AC
  Administered 2014-12-28: 60 mg via ORAL
  Filled 2014-12-28: qty 3

## 2014-12-28 NOTE — Discharge Instructions (Signed)
Take Mobic as needed for pain. Take Flexeril as needed for muscle strain. Refer to attached documents for more information. Return to the ED with worsening or concerning symptoms.

## 2014-12-28 NOTE — ED Notes (Signed)
Pt here for severe left sided chest pain that started today while at work. sts SOB over the past 2 weeks. sts sarcoidosis and usually hurts on the right side.

## 2014-12-28 NOTE — ED Provider Notes (Signed)
CSN: [id number]     Arrival date & time 12/28/14  1245 History   First MD Initiated Contact with Patient 12/28/14 1410     Chief Complaint  Patient presents with  . Chest Pain  . Shortness of Breath     (Consider location/radiation/quality/duration/timing/severity/associated sxs/prior Treatment) Patient is a 59 y.o. female presenting with chest pain and shortness of breath. The history is provided by the patient. No language interpreter was used.  Chest Pain Pain location:  L chest Pain quality: aching   Pain radiates to:  Does not radiate Pain radiates to the back: no   Pain severity:  Severe Onset quality:  Gradual Duration:  1 week Timing:  Constant Progression:  Unchanged Chronicity:  New Context: breathing   Context: no drug use, not eating, no intercourse, not lifting, no movement, not raising an arm, not at rest, no stress and no trauma   Context comment:  After using home exercise bands Relieved by:  Nothing Worsened by:  Movement Ineffective treatments:  None tried Associated symptoms: shortness of breath   Associated symptoms: no abdominal pain, no claudication, no dizziness, no numbness, no syncope and no weakness   Risk factors: diabetes mellitus and obesity   Risk factors: no aortic disease, no birth control and no coronary artery disease   Shortness of Breath Associated symptoms: chest pain   Associated symptoms: no abdominal pain, no claudication and no syncope     Past Medical History  Diagnosis Date  . Sarcoidosis   . OSA (obstructive sleep apnea)     on CPAP   . Asthma   . Chronic headaches   . Hyperlipidemia   . DM (diabetes mellitus)    Past Surgical History  Procedure Laterality Date  . Cholecystectomy  1997  . Tubal ligation  1978  . Vesicovaginal fistula closure w/ tah  1992  . Lung surgery  1998   Family History  Problem Relation Age of Onset  . Allergies Daughter   . Asthma Son   . Esophageal cancer Brother   . Throat cancer Brother    . Multiple myeloma Brother   . Prostate cancer Brother   . Multiple myeloma Sister    History  Substance Use Topics  . Smoking status: Never Smoker   . Smokeless tobacco: Never Used  . Alcohol Use: No   OB History    No data available     Review of Systems  Respiratory: Positive for shortness of breath.   Cardiovascular: Positive for chest pain. Negative for claudication and syncope.  Gastrointestinal: Negative for abdominal pain.  Neurological: Negative for dizziness, weakness and numbness.  All other systems reviewed and are negative.     Allergies  Codeine  Home Medications   Prior to Admission medications   Medication Sig Start Date End Date Taking? Authorizing Provider  Albiglutide 30 MG PEN Inject 30 mg into the skin once a week.   Yes Historical Provider, MD  albuterol (PROVENTIL) (2.5 MG/3ML) 0.083% nebulizer solution Take 3 mLs (2.5 mg total) by nebulization every 6 (six) hours as needed for wheezing or shortness of breath. 08/08/14  Yes Tanda Rockers, MD  amitriptyline (ELAVIL) 50 MG tablet Take 50 mg by mouth at bedtime.   Yes Historical Provider, MD  clonazePAM (KLONOPIN) 0.5 MG tablet Take 0.5 mg by mouth 2 (two) times daily as needed for anxiety.   Yes Historical Provider, MD  esomeprazole (NEXIUM) 20 MG capsule Take 20 mg by mouth 2 (two) times daily  before a meal. 1 twice daily   Yes Historical Provider, MD  furosemide (LASIX) 40 MG tablet Take 1 tablet (40 mg total) by mouth daily as needed. 09/30/14  Yes Tanda Rockers, MD  insulin aspart (NOVOLOG) 100 UNIT/ML injection Inject 20 Units into the skin 3 (three) times daily before meals.   Yes Historical Provider, MD  Insulin Glargine (LANTUS SOLOSTAR [place]) Inject 50 Units into the skin 2 (two) times daily.   Yes Historical Provider, MD  valsartan (DIOVAN) 160 MG tablet Take 1 tablet (160 mg total) by mouth daily. 08/08/14  Yes Tanda Rockers, MD  verapamil (CALAN-SR) 180 MG CR tablet Take 180 mg by mouth  daily. 10/25/14  Yes Historical Provider, MD  gabapentin (NEURONTIN) 300 MG capsule Take 1 capsule (300 mg total) by mouth 3 (three) times daily. Patient not taking: Reported on 12/28/2014 09/30/14   Tanda Rockers, MD   BP 130/69 mmHg  Pulse 90  Temp(Src) 98.5 F (36.9 C) (Oral)  Resp 18  SpO2 100% Physical Exam  Constitutional: She is oriented to person, place, and time. She appears well-developed and well-nourished. No distress.  HENT:  Head: Normocephalic and atraumatic.  Eyes: Conjunctivae and EOM are normal.  Neck: Normal range of motion.  Cardiovascular: Normal rate and regular rhythm.  Exam reveals no gallop and no friction rub.   No murmur heard. Pulmonary/Chest: Effort normal and breath sounds normal. She has no wheezes. She has no rales. She exhibits tenderness.  Left anterior chest tenderness to palpation. No obvious deformity. Pain elicited with pushing her palms together.   Abdominal: Soft. She exhibits no distension. There is no tenderness. There is no rebound.  Musculoskeletal: Normal range of motion.  Neurological: She is alert and oriented to person, place, and time. Coordination normal.  Speech is goal-oriented. Moves limbs without ataxia.   Skin: Skin is warm and dry.  Psychiatric: She has a normal mood and affect. Her behavior is normal.  Nursing note and vitals reviewed.   ED Course  Procedures (including critical care time) Labs Review Labs Reviewed  CBC - Abnormal; Notable for the following:    Hemoglobin 10.8 (*)    HCT 34.2 (*)    MCH 25.8 (*)    All other components within normal limits  BASIC METABOLIC PANEL - Abnormal; Notable for the following:    Glucose, Bld 136 (*)    All other components within normal limits  BRAIN NATRIURETIC PEPTIDE  I-STAT TROPOININ, ED    Imaging Review Dg Chest 2 View  12/28/2014   CLINICAL DATA:  Chest pain.  Sarcoidosis  EXAM: CHEST  2 VIEW  COMPARISON:  08/08/2014  FINDINGS: Mild scarring in the bases and apices  unchanged from the prior study. This is likely due to sarcoid. No mass or adenopathy.  Negative for pneumonia or effusion.  Negative for heart failure.  IMPRESSION: Mild pulmonary scarring.  No superimposed acute abnormality.   Electronically Signed   By: Franchot Gallo M.D.   On: 12/28/2014 13:41     EKG Interpretation None      MDM   Final diagnoses:  Muscle strain of chest wall, initial encounter    4:06 PM Patient's troponin and EKG unremarkable for acute changes. Pain is reproducible with movement and palpation of the affected area. Lungs are clear. Patient likely has a muscle strain from using home exercise bands. Vitals stable and patient afebrile. Patient will have Tramadol and Flexeril for pain.     Alvina Chou,  PA-C 12/31/14 0038  Alfonzo Beers, MD 12/31/14 [phone]

## 2014-12-29 ENCOUNTER — Other Ambulatory Visit (INDEPENDENT_AMBULATORY_CARE_PROVIDER_SITE_OTHER): Payer: Medicare Other

## 2014-12-29 ENCOUNTER — Encounter: Payer: Self-pay | Admitting: Adult Health

## 2014-12-29 ENCOUNTER — Ambulatory Visit (INDEPENDENT_AMBULATORY_CARE_PROVIDER_SITE_OTHER): Payer: Medicare Other | Admitting: Adult Health

## 2014-12-29 VITALS — BP 136/82 | HR 102 | Temp 98.2°F | Ht 62.5 in | Wt 227.0 lb

## 2014-12-29 DIAGNOSIS — D869 Sarcoidosis, unspecified: Secondary | ICD-10-CM | POA: Diagnosis not present

## 2014-12-29 DIAGNOSIS — R058 Other specified cough: Secondary | ICD-10-CM

## 2014-12-29 DIAGNOSIS — R05 Cough: Secondary | ICD-10-CM | POA: Diagnosis not present

## 2014-12-29 LAB — SEDIMENTATION RATE: Sed Rate: 35 mm/hr — ABNORMAL HIGH (ref 0–22)

## 2014-12-29 MED ORDER — ALBUTEROL SULFATE (2.5 MG/3ML) 0.083% IN NEBU: 2.5000 mg | INHALATION_SOLUTION | Freq: Four times a day (QID) | RESPIRATORY_TRACT | Status: DC | PRN

## 2014-12-29 MED ORDER — PREDNISONE 10 MG PO TABS: ORAL_TABLET | ORAL | Status: DC

## 2014-12-29 NOTE — Assessment & Plan Note (Signed)
?  Flare  Check ACE and ESR  If not imrpoving consider CT chest +/- echo  BNP was neg.   Plan  Decrease prednisone 20mg  daily for 1 week then 10mg  daily for 1 week then stop.  Labs today.  Begin Delsym 2 tsp Twice daily  For cough .  Zyrtec 10mg  At bedtime  For drainage .  Decrease Neurontin back to previous dose 100mg  Three times a day  .  Please contact office for sooner follow up if symptoms do not improve or worsen or seek emergency care  Follow up Dr. Melvyn Novas  In 3 weeks and As needed

## 2014-12-29 NOTE — Assessment & Plan Note (Signed)
Cough w/ prev ACE use  ? Upper airway vs sarcoid related No change with neurontin, resume prev dose  Use delsym and zyrtec . Cont on nexium   Plan  Decrease prednisone 20mg  daily for 1 week then 10mg  daily for 1 week then stop.  Labs today.  Begin Delsym 2 tsp Twice daily  For cough .  Zyrtec 10mg  At bedtime  For drainage .  Decrease Neurontin back to previous dose 100mg  Three times a day  .  Please contact office for sooner follow up if symptoms do not improve or worsen or seek emergency care  Follow up Dr. Melvyn Novas  In 3 weeks and As needed

## 2014-12-29 NOTE — Patient Instructions (Addendum)
Decrease prednisone 20mg  daily for 1 week then 10mg  daily for 1 week then stop.  Labs today.  Begin Delsym 2 tsp Twice daily  For cough .  Zyrtec 10mg  At bedtime  For drainage .  Decrease Neurontin back to previous dose 100mg  Three times a day  .  Please contact office for sooner follow up if symptoms do not improve or worsen or seek emergency care  Follow up Dr. Melvyn Novas  In 3 weeks and As needed

## 2014-12-29 NOTE — Addendum Note (Signed)
Addended by: Parke Poisson E on: 12/29/2014 04:58 PM   Modules accepted: Orders

## 2014-12-29 NOTE — Progress Notes (Signed)
Subjective:    Patient ID: Cathy Stevenson, female    DOB: 1956/01/22,    MRN: [id number]    Brief patient profile:  69 yobf dx sarcoid in 1980s presenting as cough/sob  requiring "chemo" at one point in the 90s (cytoxan)  and last prednisone 2014 helps but drives up sugar so referred by Avbuerre to pulmonary clinic 08/08/2014 for refractory cough ? Sarcoid related.    History of Present Illness  08/08/2014 1st [facility] office visit/ Wert  / on ACEi for ? Decades  Chief Complaint  Patient presents with  . Pulmonary Consult    Referred by Dr. Haskel Schroeder. Pt states that she was dxed with Sarcoid 32 yrs ago  by lung bx. She c/o cough and chest discomfort that has bothered her since she was dxed. Her cough is prod with minimal yellow sputum.  She also c/o DOE for the past 2 wks- with walking short distances and lifting things.   cough is chronic x years, more day than night, and when severe assoc with doe x more than room to room with audible wheezing/ sensation of something stuck in her throat but actually very little mucus production, saba not very helpful rec Stop ramapril and your symptoms should gradually improve over several weeks Start diovan 160 mg one daily  Ok to use the nebulizer if needed up to 4 hours > breathing/ chest tightness/ asthma/ wheezing (it will last 4 hours)  Change nexium where you take it Take 30- 60 min before your first and last meals of the day GERD diet  Please remember to go to the x-ray department   09/30/2014 f/u ov/Wert re: ? Sarcoid  Chief Complaint  Patient presents with  . Follow-up    PFT done today. Pt states that her cough and SOB are unchanged since the last visit. She has not needed albuterol neb or inhaler. No new co's today.   on nexium 20 mg bid but not taking ac as rec  slt swelling when upright since change to diovan controls with prn lasix >>neruotin 300 Three times a day    12/29/2014 Acute OV /ER follow up  Seen in ER  yesterday for persistent cough and chest tightness.  Complains of  chest tightness, prod cough with thick clear mucus, increased SOB x1 week, worse x1 day resulting in ER visit.   pt believes this is due to her Sarcoid Dx w/ pulled muscle, given steroids in ER and rx mobic.  No better.  Still coughing a lot.  Pain on inspiration.  Cardiac enzymes neg. nml BNP.   Denies chest pain ,orthopnea, edema or fever.    Current Medications, Allergies, Complete Past Medical History, Past Surgical History, Family History, and Social History were reviewed in [employer] record.  ROS  The following are not active complaints unless bolded sore throat, dysphagia, dental problems, itching, sneezing,  , ear ache,   fever, chills, sweats, unintended wt loss, pleuritic or exertional cp, hemoptysis,  orthopnea pnd  presyncope, palpitations, heartburn, abdominal pain, anorexia, nausea, vomiting, diarrhea  or change in bowel or urinary habits, change in stools or urine, dysuria,hematuria,  rash, arthralgias, visual complaints, headache, numbness weakness or ataxia or problems with walking or coordination,  change in mood/affect or memory.                             Objective:   Physical Exam  amb obese bf  nad    Vital signs reviewed    HEENT: nl dentition, turbinates, and orophanx. Nl external ear canals without cough reflex   NECK :  without JVD/Nodes/TM/ nl carotid upstrokes bilaterally   LUNGS: no acc muscle use, clear to A and P bilaterally without cough on insp or exp maneuvers   CV:  RRR  no s3 or murmur or increase in P2, no edema   ABD:  soft and nontender with nl excursion in the supine position. No bruits or organomegaly, bowel sounds nl  MS:  warm without deformities, calf tenderness, cyanosis or clubbing  SKIN: warm and dry without lesions    NEURO:  alert, approp, no deficits    CXR: 12/28/14 mild pulm scarring , no acute process.    I personally  reviewed images and agree with radiology impression as follows:        Assessment & Plan:

## 2014-12-30 LAB — ANGIOTENSIN CONVERTING ENZYME: Angiotensin-Converting Enzyme: 54 U/L — ABNORMAL HIGH (ref 8–52)

## 2015-01-09 ENCOUNTER — Other Ambulatory Visit: Payer: Self-pay | Admitting: *Deleted

## 2015-01-09 ENCOUNTER — Telehealth: Payer: Self-pay | Admitting: *Deleted

## 2015-01-09 MED ORDER — ALBUTEROL SULFATE (2.5 MG/3ML) 0.083% IN NEBU: 2.5000 mg | INHALATION_SOLUTION | Freq: Four times a day (QID) | RESPIRATORY_TRACT | Status: DC | PRN

## 2015-01-09 NOTE — Telephone Encounter (Signed)
Called patient and let her know Albuterol neb medication should be ready for pick up this afternoon. No PA needed.

## 2015-01-09 NOTE — Telephone Encounter (Signed)
PA request submitted for Albuterol 0.083% [facility] did not have dx code on rx Patient has Medicare/Medicaid  rx resubmitted and pharmacy will re-run. rx was 12.50 without dx code ran under Medicare.

## 2015-01-19 ENCOUNTER — Other Ambulatory Visit: Payer: Self-pay | Admitting: Internal Medicine

## 2015-01-19 ENCOUNTER — Telehealth: Payer: Self-pay | Admitting: Internal Medicine

## 2015-01-19 ENCOUNTER — Ambulatory Visit (INDEPENDENT_AMBULATORY_CARE_PROVIDER_SITE_OTHER): Payer: Medicare Other | Admitting: Internal Medicine

## 2015-01-19 ENCOUNTER — Encounter: Payer: Self-pay | Admitting: Internal Medicine

## 2015-01-19 VITALS — BP 118/64 | HR 100 | Ht 62.5 in | Wt 226.0 lb

## 2015-01-19 DIAGNOSIS — D869 Sarcoidosis, unspecified: Secondary | ICD-10-CM

## 2015-01-19 DIAGNOSIS — R05 Cough: Secondary | ICD-10-CM | POA: Diagnosis not present

## 2015-01-19 DIAGNOSIS — I10 Essential (primary) hypertension: Secondary | ICD-10-CM | POA: Diagnosis not present

## 2015-01-19 DIAGNOSIS — R058 Other specified cough: Secondary | ICD-10-CM

## 2015-01-19 DIAGNOSIS — R0600 Dyspnea, unspecified: Secondary | ICD-10-CM | POA: Diagnosis not present

## 2015-01-19 MED ORDER — MELOXICAM 7.5 MG PO TABS: ORAL_TABLET | ORAL | Status: DC

## 2015-01-19 MED ORDER — GABAPENTIN 100 MG PO CAPS
100.0000 mg | ORAL_CAPSULE | Freq: Four times a day (QID) | ORAL | Status: DC
Start: 2015-01-19 — End: 2016-07-13

## 2015-01-19 MED ORDER — FAMOTIDINE 20 MG PO TABS: ORAL_TABLET | ORAL | Status: DC

## 2015-01-19 MED ORDER — PANTOPRAZOLE SODIUM 40 MG PO TBEC: 40.0000 mg | DELAYED_RELEASE_TABLET | Freq: Every day | ORAL | Status: DC

## 2015-01-19 NOTE — Patient Instructions (Addendum)
Pantoprazole (protonix) 40 mg (nexium 40 mg)   Take  30-60 min before first meal of the day and Pepcid (famotidine)  20 mg one @  bedtime until return to office - this is the best way to tell whether stomach acid is contributing to your problem.    Avoid   foods that you know cause gas (especially boiled eggs/  beans and raw vegetables like spinach and salads)  and citrucel 1 heaping tsp twice daily with a large glass of water.  Pain should improve w/in 2 weeks and if not then consider further GI work up.      For chest pain ok to try mobic 7.5 mg up to twice daily with meals as needed   Restart neurtonin 100 mg four times daily automatically   See Tammy NP w/in 2 weeks with all your medications, even over the counter meds, separated in two separate bags, the ones you take no matter what vs the ones you stop once you feel better and take only as needed when you feel you need them.   Tammy  will generate for you a new user friendly medication calendar that will put [place] all on the same page re: your medication use.     Without this process, it simply isn't possible to assure that we are providing  your outpatient care  with  the attention to detail we feel you deserve.   If we cannot assure that you're getting that kind of care,  then we cannot manage your problem effectively from this clinic.  Once you have seen Tammy and we are sure that we're all on the same page with your medication use she will arrange follow up with me.

## 2015-01-19 NOTE — Progress Notes (Signed)
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 Sarcoid related.    History of Present Illness  08/08/2014 1st [facility] office visit/ Cathy Stevenson  / on ACEi for ? Decades  Chief Complaint  Patient presents with  . Pulmonary Consult    Referred by Dr. Haskel Schroeder. Pt states that she was dxed with Sarcoid 32 yrs ago  by lung bx. She c/o cough and chest discomfort that has bothered her since she was dxed. Her cough is prod with minimal yellow sputum.  She also c/o DOE for the past 2 wks- with walking short distances and lifting things.   cough is chronic x years, more day than night, and when severe assoc with doe x more than room to room with audible wheezing/ sensation of something stuck in her throat but actually very little mucus production, saba not very helpful rec Stop ramapril and your symptoms should gradually improve over several weeks Start diovan 160 mg one daily  Ok to use the nebulizer if needed up to 4 hours > breathing/ chest tightness/ asthma/ wheezing (it will last 4 hours)  Change nexium where you take it Take 30- 60 min before your first and last meals of the day GERD diet    09/30/2014 f/u ov/Cathy Stevenson re: ? Sarcoid  Chief Complaint  Patient presents with  . Follow-up    PFT done today. Pt states that her cough and SOB are unchanged since the last visit. She has not needed albuterol neb or inhaler. No new co's today.   on nexium 20 mg bid but not taking ac as rec  slt swelling when upright since change to diovan controls with prn lasix >>neurontin 300 Three times a day    12/29/2014 NP Acute OV /ER follow up  Seen in ER 12/28/14  for persistent cough  and chest tightness.  Complains of  chest tightness, prod cough with thick clear mucus, increased SOB x 1 week, worse x1 day resulting in ER visit.   pt believes this is due to her Sarcoid Dx w/ pulled muscle, given steroids in ER and rx mobic.  No better.  Still coughing a lot.  Pain on inspiration.  Cardiac enzymes neg. nml BNP.  rec Decrease prednisone 20mg  daily for 1 week then 10mg  daily for 1 week then stop.  Labs today.  Begin Delsym 2 tsp Twice daily  For cough .  Zyrtec 10mg  At bedtime  For drainage .  Decrease Neurontin back to previous dose 100mg  Three times a day      01/19/2015 f/u ov/Cathy Stevenson re: sarcoid/ chronic cough x years/ not clear really which meds she's taking  Chief Complaint  Patient presents with  . Follow-up    Pt states she is feeling no better since the last visit- still c/o chest tightness, cough with white sputum and SOB.  She is using neb 3 x per day on average.  only better while on mobic and prednisone x 5 days /otherwise the same x years   Last neb was 12 h prior to OV   L ant cp x one month, worse with cough or bending down, "  different from my sarcoid pain which was on the R"  No obvious day to day or daytime variabilty or assoc excess or purulent sputum  Or   subjective wheeze overt sinus or hb symptoms. No unusual exp hx or h/o childhood pna/ asthma or knowledge of premature birth.  Sleeping ok without nocturnal  or early am exacerbation  of respiratory  c/o's or need for noct saba. Also denies any obvious fluctuation of symptoms with weather or environmental changes or other aggravating or alleviating factors except as outlined above   Current Medications, Allergies, Complete Past Medical History, Past Surgical History, Family History, and Social History were reviewed in [employer] record.  ROS  The following are not active complaints unless bolded sore throat, dysphagia, dental problems, itching, sneezing,  nasal congestion or  excess/ purulent secretions, ear ache,   fever, chills, sweats, unintended wt loss,  exertional cp, hemoptysis,  orthopnea pnd or leg swelling, presyncope, palpitations, heartburn, abdominal pain, anorexia, nausea, vomiting, diarrhea  or change in bowel or urinary habits, change in stools or urine, dysuria,hematuria,  rash, arthralgias, visual complaints, headache, numbness weakness or ataxia or problems with walking or coordination,  change in mood/affect or memory.                Objective:   Physical Exam  amb obese bf nad    Wt Readings from Last 3 Encounters:  01/19/15 226 lb (102.513 kg)  12/29/14 227 lb (102.967 kg)  09/30/14 222 lb (100.699 kg)    Vital signs reviewed    HEENT: nl dentition, turbinates, and orophanx. Nl external ear canals without cough reflex   NECK :  without JVD/Nodes/TM/ nl carotid upstrokes bilaterally   LUNGS: no acc muscle use, clear to A and P bilaterally without cough on insp or exp maneuvers   CV:  RRR  no s3 or murmur or increase in P2, no edema   ABD:  soft and nontender with nl excursion in the supine position. No bruits or organomegaly, bowel sounds nl  MS:  warm without deformities, calf tenderness, cyanosis or clubbing  SKIN: warm and dry without lesions    NEURO:  alert, approp, no deficits       I personally reviewed images and agree with radiology impression as follows:   CXR: 12/28/14 mild pulm scarring , no acute process.        Assessment & Plan:

## 2015-01-19 NOTE — Telephone Encounter (Signed)
Per MW d/c instruction for 01/19/15,   Restart neurtonin 100 mg four times daily automatically   [place] @ [facility] informed. Nothing further needed at this time.

## 2015-01-21 ENCOUNTER — Encounter: Payer: Self-pay | Admitting: Internal Medicine

## 2015-01-21 NOTE — Assessment & Plan Note (Signed)
No evidence at all of active dz/ the cp on the L is most c/w IBS ( See instructions for specific recommendations which were reviewed directly with the patient who was given a copy with highlighter outlining the key components. )

## 2015-01-21 NOTE — Assessment & Plan Note (Signed)
-   trial off acei 08/09/2014 >  Not convinced better 09/30/14 > rec neurontin 300 tid and max gerd rx > could not tol 300 so d/c ? Helped? - pfts s airflow obst 09/30/14   I had an extended discussion with the patient reviewing all relevant studies completed to date and  lasting 15 to 20 minutes of a 25 minute visit on the following ongoing concerns:  The standardized cough guidelines published in Chest by Lissa Morales in 2006 are still the best available and consist of a multiple step process (up to 12!) , not a single office visit,  and are intended  to address this problem logically,  with an alogrithm dependent on response to empiric treatment at  each progressive step  to determine a specific diagnosis with  minimal addtional testing needed. Therefore if adherence is an issue or can't be accurately verified,  it's very unlikely the standard evaluation and treatment will be successful here.    Furthermore, response to therapy (other than acute cough suppression, which should only be used short term with avoidance of narcotic containing cough syrups if possible), can be a gradual process for which the patient may perceive immediate benefit.  Unlike going to an eye doctor where the best perscription is almost always the first one and is immediately effective, this is almost never the case in the management of chronic cough syndromes. Therefore the patient needs to commit up front to consistently adhere to recommendations  for up to 6 weeks of therapy directed at the likely underlying problem(s) before the response can be reasonably evaluated.    rec rechallenge with neurontin 100 qid

## 2015-01-21 NOTE — Assessment & Plan Note (Addendum)
-   01/19/2015  Walked RA x 3 laps @ 185 ft each stopped due to end of study nl pace, no desat  - 01/19/15 spirometry 12 h p last neb > no airflow obst    Symptoms are markedly disproportionate to objective findings and not clear this is a lung problem but pt does appear to have difficult airway management issues. DDX of  difficult airways management all start with A and  include Adherence, Ace Inhibitors, Acid Reflux, Active Sinus Disease, Alpha 1 Antitripsin deficiency, Anxiety masquerading as Airways dz,  ABPA,  allergy(esp in young), Aspiration (esp in elderly), Adverse effects of DPI,  Active smokers, plus two Bs  = Bronchiectasis and Beta blocker use..and one C= CHF   Adherence is always the initial "prime suspect" and is a multilayered concern that requires a "trust but verify" approach in every patient - starting with knowing how to use medications, especially inhalers, correctly, keeping up with refills and understanding the fundamental difference between maintenance and prns vs those medications only taken for a very short course and then stopped and not refilled.  - not really clear what meds she's taking -  To keep things simple, I have asked the patient to first separate medicines that are perceived as maintenance, that is to be taken daily "no matter what", from those medicines that are taken on only on an as-needed basis and I have given the patient examples of both, and then return to see our NP to generate a  detailed  medication calendar which should be followed until the next physician sees the patient and updates it.   ? Acid (or non-acid) GERD > always difficult to exclude as up to 75% of pts in some series report no assoc GI/ Heartburn symptoms> rec max (24h)  acid suppression and diet restrictions/ reviewed and instructions given in writing.  ? Anxiety > dx of exclusion   Each maintenance medication was reviewed in detail including most importantly the difference between maintenance  and as needed and under what circumstances the prns are to be used.  Please see instructions for details which were reviewed in writing and the patient given a copy.

## 2015-01-22 ENCOUNTER — Encounter: Payer: Self-pay | Admitting: Internal Medicine

## 2015-01-22 NOTE — Assessment & Plan Note (Signed)
off acei 08/09/2014 due to pseudowheeze > resolved at f/u ov 09/30/14   Adequate control on present rx, reviewed > no change in rx needed

## 2015-01-23 DIAGNOSIS — I10 Essential (primary) hypertension: Secondary | ICD-10-CM | POA: Diagnosis not present

## 2015-01-23 DIAGNOSIS — D869 Sarcoidosis, unspecified: Secondary | ICD-10-CM | POA: Diagnosis not present

## 2015-01-23 DIAGNOSIS — E1165 Type 2 diabetes mellitus with hyperglycemia: Secondary | ICD-10-CM | POA: Diagnosis not present

## 2015-01-23 DIAGNOSIS — K219 Gastro-esophageal reflux disease without esophagitis: Secondary | ICD-10-CM | POA: Diagnosis not present

## 2015-02-06 ENCOUNTER — Encounter: Payer: Medicare Other | Admitting: Adult Health

## 2015-02-27 DIAGNOSIS — E1165 Type 2 diabetes mellitus with hyperglycemia: Secondary | ICD-10-CM | POA: Diagnosis not present

## 2015-02-27 DIAGNOSIS — D869 Sarcoidosis, unspecified: Secondary | ICD-10-CM | POA: Diagnosis not present

## 2015-02-27 DIAGNOSIS — N3000 Acute cystitis without hematuria: Secondary | ICD-10-CM | POA: Diagnosis not present

## 2015-02-27 DIAGNOSIS — K219 Gastro-esophageal reflux disease without esophagitis: Secondary | ICD-10-CM | POA: Diagnosis not present

## 2015-02-27 DIAGNOSIS — R079 Chest pain, unspecified: Secondary | ICD-10-CM | POA: Diagnosis not present

## 2015-02-27 DIAGNOSIS — R071 Chest pain on breathing: Secondary | ICD-10-CM | POA: Diagnosis not present

## 2015-02-27 DIAGNOSIS — I10 Essential (primary) hypertension: Secondary | ICD-10-CM | POA: Diagnosis not present

## 2015-04-17 ENCOUNTER — Other Ambulatory Visit: Payer: Self-pay | Admitting: Adult Health

## 2015-04-17 MED ORDER — ALBUTEROL SULFATE (2.5 MG/3ML) 0.083% IN NEBU: 2.5000 mg | INHALATION_SOLUTION | Freq: Four times a day (QID) | RESPIRATORY_TRACT | Status: DC | PRN

## 2015-04-17 NOTE — Telephone Encounter (Signed)
Received paper refill request for albuterol 0.083% Take 37mls every 6 hours as needed  Refilled per last OV.  Nothing further needed

## 2015-05-13 ENCOUNTER — Other Ambulatory Visit: Payer: Self-pay | Admitting: Internal Medicine

## 2015-05-19 DIAGNOSIS — E1165 Type 2 diabetes mellitus with hyperglycemia: Secondary | ICD-10-CM | POA: Diagnosis not present

## 2015-05-19 DIAGNOSIS — Z1239 Encounter for other screening for malignant neoplasm of breast: Secondary | ICD-10-CM | POA: Diagnosis not present

## 2015-05-19 DIAGNOSIS — F419 Anxiety disorder, unspecified: Secondary | ICD-10-CM | POA: Diagnosis not present

## 2015-05-19 DIAGNOSIS — I10 Essential (primary) hypertension: Secondary | ICD-10-CM | POA: Diagnosis not present

## 2015-05-19 DIAGNOSIS — E784 Other hyperlipidemia: Secondary | ICD-10-CM | POA: Diagnosis not present

## 2015-05-19 DIAGNOSIS — M5080 Other cervical disc disorders, unspecified cervical region: Secondary | ICD-10-CM | POA: Diagnosis not present

## 2015-06-13 ENCOUNTER — Other Ambulatory Visit: Payer: Self-pay

## 2015-06-13 DIAGNOSIS — M5080 Other cervical disc disorders, unspecified cervical region: Secondary | ICD-10-CM | POA: Diagnosis not present

## 2015-06-13 DIAGNOSIS — L909 Atrophic disorder of skin, unspecified: Secondary | ICD-10-CM | POA: Diagnosis not present

## 2015-06-13 DIAGNOSIS — I10 Essential (primary) hypertension: Secondary | ICD-10-CM | POA: Diagnosis not present

## 2015-06-13 DIAGNOSIS — Z1231 Encounter for screening mammogram for malignant neoplasm of breast: Secondary | ICD-10-CM

## 2015-06-13 DIAGNOSIS — Z23 Encounter for immunization: Secondary | ICD-10-CM | POA: Diagnosis not present

## 2015-06-13 DIAGNOSIS — E1165 Type 2 diabetes mellitus with hyperglycemia: Secondary | ICD-10-CM | POA: Diagnosis not present

## 2015-06-13 DIAGNOSIS — J302 Other seasonal allergic rhinitis: Secondary | ICD-10-CM | POA: Diagnosis not present

## 2015-07-12 DIAGNOSIS — L918 Other hypertrophic disorders of the skin: Secondary | ICD-10-CM | POA: Diagnosis not present

## 2015-07-12 DIAGNOSIS — B078 Other viral warts: Secondary | ICD-10-CM | POA: Diagnosis not present

## 2015-07-12 DIAGNOSIS — L83 Acanthosis nigricans: Secondary | ICD-10-CM | POA: Diagnosis not present

## 2015-07-17 ENCOUNTER — Ambulatory Visit
Admission: RE | Admit: 2015-07-17 | Discharge: 2015-07-17 | Disposition: A | Discharge status: Home / Self Care | Payer: Medicare Other | Source: Ambulatory Visit

## 2015-07-17 DIAGNOSIS — Z1231 Encounter for screening mammogram for malignant neoplasm of breast: Secondary | ICD-10-CM

## 2015-07-18 ENCOUNTER — Other Ambulatory Visit: Payer: Self-pay | Admitting: Internal Medicine

## 2015-07-18 DIAGNOSIS — N6459 Other signs and symptoms in breast: Secondary | ICD-10-CM

## 2015-07-18 DIAGNOSIS — N63 Unspecified lump in unspecified breast: Secondary | ICD-10-CM

## 2015-07-18 DIAGNOSIS — N644 Mastodynia: Secondary | ICD-10-CM

## 2015-07-22 ENCOUNTER — Other Ambulatory Visit: Payer: Self-pay | Admitting: Internal Medicine

## 2015-07-24 ENCOUNTER — Other Ambulatory Visit: Payer: Self-pay | Admitting: Internal Medicine

## 2015-07-24 DIAGNOSIS — N644 Mastodynia: Secondary | ICD-10-CM

## 2015-07-24 DIAGNOSIS — N6459 Other signs and symptoms in breast: Secondary | ICD-10-CM

## 2015-07-24 DIAGNOSIS — N63 Unspecified lump in unspecified breast: Secondary | ICD-10-CM

## 2015-07-26 ENCOUNTER — Other Ambulatory Visit: Payer: Medicare Other

## 2015-08-08 DIAGNOSIS — L909 Atrophic disorder of skin, unspecified: Secondary | ICD-10-CM | POA: Diagnosis not present

## 2015-08-08 DIAGNOSIS — Z719 Counseling, unspecified: Secondary | ICD-10-CM | POA: Diagnosis not present

## 2015-08-08 DIAGNOSIS — E784 Other hyperlipidemia: Secondary | ICD-10-CM | POA: Diagnosis not present

## 2015-08-08 DIAGNOSIS — I10 Essential (primary) hypertension: Secondary | ICD-10-CM | POA: Diagnosis not present

## 2015-08-08 DIAGNOSIS — E1165 Type 2 diabetes mellitus with hyperglycemia: Secondary | ICD-10-CM | POA: Diagnosis not present

## 2015-08-24 ENCOUNTER — Ambulatory Visit
Admission: RE | Admit: 2015-08-24 | Discharge: 2015-08-24 | Disposition: A | Discharge status: Home / Self Care | Payer: Medicare Other | Source: Ambulatory Visit | Attending: Internal Medicine | Admitting: Internal Medicine

## 2015-08-24 DIAGNOSIS — N6489 Other specified disorders of breast: Secondary | ICD-10-CM | POA: Diagnosis not present

## 2015-08-24 DIAGNOSIS — N63 Unspecified lump in unspecified breast: Secondary | ICD-10-CM

## 2015-08-24 DIAGNOSIS — N644 Mastodynia: Secondary | ICD-10-CM

## 2015-08-24 DIAGNOSIS — N6459 Other signs and symptoms in breast: Secondary | ICD-10-CM

## 2015-08-24 DIAGNOSIS — R928 Other abnormal and inconclusive findings on diagnostic imaging of breast: Secondary | ICD-10-CM | POA: Diagnosis not present

## 2015-09-22 ENCOUNTER — Other Ambulatory Visit: Payer: Self-pay | Admitting: Internal Medicine

## 2015-09-26 ENCOUNTER — Other Ambulatory Visit: Payer: Self-pay | Admitting: Internal Medicine

## 2015-12-28 DIAGNOSIS — E784 Other hyperlipidemia: Secondary | ICD-10-CM | POA: Diagnosis not present

## 2015-12-28 DIAGNOSIS — E1165 Type 2 diabetes mellitus with hyperglycemia: Secondary | ICD-10-CM | POA: Diagnosis not present

## 2015-12-28 DIAGNOSIS — I10 Essential (primary) hypertension: Secondary | ICD-10-CM | POA: Diagnosis not present

## 2015-12-28 DIAGNOSIS — R0782 Intercostal pain: Secondary | ICD-10-CM | POA: Diagnosis not present

## 2016-01-18 DIAGNOSIS — Z862 Personal history of diseases of the blood and blood-forming organs and certain disorders involving the immune mechanism: Secondary | ICD-10-CM | POA: Diagnosis not present

## 2016-01-18 DIAGNOSIS — H2513 Age-related nuclear cataract, bilateral: Secondary | ICD-10-CM | POA: Diagnosis not present

## 2016-01-18 DIAGNOSIS — E119 Type 2 diabetes mellitus without complications: Secondary | ICD-10-CM | POA: Diagnosis not present

## 2016-01-30 DIAGNOSIS — D869 Sarcoidosis, unspecified: Secondary | ICD-10-CM | POA: Diagnosis not present

## 2016-01-30 DIAGNOSIS — I10 Essential (primary) hypertension: Secondary | ICD-10-CM | POA: Diagnosis not present

## 2016-01-30 DIAGNOSIS — R600 Localized edema: Secondary | ICD-10-CM | POA: Diagnosis not present

## 2016-01-30 DIAGNOSIS — K219 Gastro-esophageal reflux disease without esophagitis: Secondary | ICD-10-CM | POA: Diagnosis not present

## 2016-01-30 DIAGNOSIS — E1165 Type 2 diabetes mellitus with hyperglycemia: Secondary | ICD-10-CM | POA: Diagnosis not present

## 2016-05-20 ENCOUNTER — Emergency Department (HOSPITAL_COMMUNITY): Payer: Medicare Other

## 2016-05-20 ENCOUNTER — Emergency Department (HOSPITAL_COMMUNITY)
Admission: EM | Admit: 2016-05-20 | Discharge: 2016-05-20 | Disposition: A | Discharge status: Home / Self Care | Payer: Medicare Other | Attending: Emergency Medicine | Admitting: Emergency Medicine

## 2016-05-20 ENCOUNTER — Encounter (HOSPITAL_COMMUNITY): Payer: Self-pay

## 2016-05-20 DIAGNOSIS — R079 Chest pain, unspecified: Secondary | ICD-10-CM | POA: Diagnosis not present

## 2016-05-20 DIAGNOSIS — I10 Essential (primary) hypertension: Secondary | ICD-10-CM | POA: Insufficient documentation

## 2016-05-20 DIAGNOSIS — Z7982 Long term (current) use of aspirin: Secondary | ICD-10-CM | POA: Insufficient documentation

## 2016-05-20 DIAGNOSIS — E119 Type 2 diabetes mellitus without complications: Secondary | ICD-10-CM | POA: Insufficient documentation

## 2016-05-20 DIAGNOSIS — J209 Acute bronchitis, unspecified: Secondary | ICD-10-CM | POA: Diagnosis not present

## 2016-05-20 DIAGNOSIS — Z7984 Long term (current) use of oral hypoglycemic drugs: Secondary | ICD-10-CM | POA: Diagnosis not present

## 2016-05-20 DIAGNOSIS — J40 Bronchitis, not specified as acute or chronic: Secondary | ICD-10-CM

## 2016-05-20 DIAGNOSIS — R0789 Other chest pain: Secondary | ICD-10-CM | POA: Diagnosis not present

## 2016-05-20 DIAGNOSIS — J45909 Unspecified asthma, uncomplicated: Secondary | ICD-10-CM | POA: Diagnosis not present

## 2016-05-20 DIAGNOSIS — R0602 Shortness of breath: Secondary | ICD-10-CM | POA: Diagnosis present

## 2016-05-20 LAB — CBC
HCT: 37.8 % (ref 36.0–46.0)
HEMOGLOBIN: 11.5 g/dL — AB (ref 12.0–15.0)
MCH: 25.8 pg — AB (ref 26.0–34.0)
MCHC: 30.4 g/dL (ref 30.0–36.0)
MCV: 84.9 fL (ref 78.0–100.0)
Platelets: 221 10*3/uL (ref 150–400)
RBC: 4.45 MIL/uL (ref 3.87–5.11)
RDW: 13.1 % (ref 11.5–15.5)
WBC: 7.8 10*3/uL (ref 4.0–10.5)

## 2016-05-20 LAB — BASIC METABOLIC PANEL
ANION GAP: 9 (ref 5–15)
BUN: 10 mg/dL (ref 6–20)
CHLORIDE: 102 mmol/L (ref 101–111)
CO2: 28 mmol/L (ref 22–32)
Calcium: 9.9 mg/dL (ref 8.9–10.3)
Creatinine, Ser: 0.87 mg/dL (ref 0.44–1.00)
GFR calc non Af Amer: >60 mL/min (ref >60)
Glucose, Bld: 142 mg/dL — ABNORMAL HIGH (ref 65–99)
Potassium: 3.4 mmol/L — ABNORMAL LOW (ref 3.5–5.1)
Sodium: 139 mmol/L (ref 135–145)

## 2016-05-20 LAB — I-STAT TROPONIN, ED: TROPONIN I, POC: 0.01 ng/mL (ref 0.00–0.08)

## 2016-05-20 MED ORDER — MORPHINE SULFATE (PF) 4 MG/ML IV SOLN
4.0000 mg | Freq: Once | INTRAVENOUS | Status: AC
  Administered 2016-05-20: 4 mg via INTRAVENOUS
  Filled 2016-05-20: qty 1

## 2016-05-20 MED ORDER — FLUCONAZOLE 200 MG PO TABS: 200.0000 mg | ORAL_TABLET | Freq: Every day | ORAL | 0 refills | Status: AC

## 2016-05-20 MED ORDER — AZITHROMYCIN 250 MG PO TABS: 250.0000 mg | ORAL_TABLET | Freq: Every day | ORAL | 0 refills | Status: DC

## 2016-05-20 NOTE — ED Provider Notes (Signed)
[facility]ENC[facility] DEPT Provider Note   CSN: [id number] Arrival date & time: 05/20/16  0815     History   Chief Complaint Chief Complaint  Patient presents with  . Chest Pain  . Shortness of Breath    HPI Cathy Stevenson is a 60 y.o. female.  Chest pain for 4 days intermittently with associated productive cough. No known history of heart disease. Past medical history includes sarcoidosis, diabetes, anemia. Review systems positive for mild dyspnea but no diaphoresis or nausea. Severity of symptoms is mild.      Past Medical History:  Diagnosis Date  . Asthma   . Chronic headaches   . DM (diabetes mellitus) (HCC)   . Hyperlipidemia   . OSA (obstructive sleep apnea)    on CPAP   . Sarcoidosis [facility])     Patient Active Problem List   Diagnosis Date Noted  . Dyspnea 01/19/2015  . Upper airway cough syndrome 08/09/2014  . Essential hypertension 08/09/2014  . Sarcoidosis (HCC) 08/08/2014    Past Surgical History:  Procedure Laterality Date  . CHOLECYSTECTOMY  1997  . LUNG SURGERY  1998  . TUBAL LIGATION  1978  . VESICOVAGINAL FISTULA CLOSURE W/ TAH  1992    OB History    No data available       Home Medications    Prior to Admission medications   Medication Sig Start Date End Date Taking? Authorizing Provider  albuterol (PROVENTIL) (2.5 MG/3ML) 0.083% nebulizer solution Take 3 mLs (2.5 mg total) by nebulization every 6 (six) hours as needed for wheezing or shortness of breath. Dx: D86.9 04/17/15  Yes Tammy S Parrett, NP  amitriptyline (ELAVIL) 50 MG tablet Take 50 mg by mouth at bedtime.   Yes Historical Provider, MD  aspirin EC 81 MG tablet Take 81 mg by mouth daily.   Yes Historical Provider, MD  cetirizine (ZYRTEC) 10 MG tablet Take 10 mg by mouth daily.   Yes Historical Provider, MD  clonazePAM (KLONOPIN) 0.5 MG tablet Take 0.5 mg by mouth at bedtime as needed for anxiety.    Yes Historical Provider, MD  cyclobenzaprine (FLEXERIL) 10 MG tablet Take 1 tablet  (10 mg total) by mouth 2 (two) times daily as needed for muscle spasms. 12/28/14  Yes Kaitlyn Szekalski, PA-C  furosemide (LASIX) 40 MG tablet Take 1 tablet (40 mg total) by mouth daily as needed. 09/30/14  Yes Nyoka Cowden, MD  insulin aspart (NOVOLOG) 100 UNIT/ML injection Inject 20 Units into the skin 3 (three) times daily before meals.   Yes Historical Provider, MD  Insulin Glargine (LANTUS SOLOSTAR [place]) Inject 50 Units into the skin 2 (two) times daily.   Yes Historical Provider, MD  pantoprazole (PROTONIX) 40 MG tablet TAKE 1 TABLET BY MOUTH DAILY 30 TO 60 MINUTES BEFORE FIRST MEAL OF THE DAY 01/19/15  Yes Nyoka Cowden, MD  valsartan (DIOVAN) 160 MG tablet TAKE 1 TABLET BY MOUTH DAILY 07/24/15  Yes Nyoka Cowden, MD  verapamil (CALAN-SR) 180 MG CR tablet Take 180 mg by mouth daily. 10/25/14  Yes Historical Provider, MD  Albiglutide 30 MG PEN Inject 30 mg into the skin once a week.    Historical Provider, MD  azithromycin (ZITHROMAX) 250 MG tablet Take 1 tablet (250 mg total) by mouth daily. Take first 2 tablets together, then 1 every day until finished. 05/20/16   Donnetta Hutching, MD  famotidine (PEPCID) 20 MG tablet One at bedtime Patient not taking: Reported on 05/20/2016 01/19/15   Charlaine Dalton  Wert, MD  fluconazole (DIFLUCAN) 200 MG tablet Take 1 tablet (200 mg total) by mouth daily. 05/20/16 05/27/16  Nat Christen, MD  gabapentin (NEURONTIN) 100 MG capsule Take 1 capsule (100 mg total) by mouth 4 (four) times daily. One three times daily Patient not taking: Reported on 05/20/2016 01/19/15   Tanda Rockers, MD  meloxicam [facility]) 7.5 MG tablet One twice daily as needed for chest/ joint pain Patient not taking: Reported on 05/20/2016 01/19/15   Tanda Rockers, MD    Family History Family History  Problem Relation Age of Onset  . Allergies Daughter   . Asthma Son   . Esophageal cancer Brother   . Throat cancer Brother   . Multiple myeloma Brother   . Prostate cancer Brother   . Multiple myeloma Sister      Social History Social History  Substance Use Topics  . Smoking status: Never Smoker  . Smokeless tobacco: Never Used  . Alcohol use No     Allergies   Codeine   Review of Systems Review of Systems  All other systems reviewed and are negative.    Physical Exam Updated Vital Signs BP 118/70 (BP Location: Left Arm)   Pulse 94   Temp 98.3 F (36.8 C) (Oral)   Resp 16   Ht 5' 2.5" (1.588 m)   Wt 225 lb (102.1 kg)   SpO2 99%   BMI 40.50 kg/m   Physical Exam  Constitutional: She is oriented to person, place, and time. She appears well-developed and well-nourished.  HENT:  Head: Normocephalic and atraumatic.  Eyes: Conjunctivae are normal.  Neck: Neck supple.  Cardiovascular: Normal rate and regular rhythm.   Pulmonary/Chest: Effort normal and breath sounds normal.  Abdominal: Soft. Bowel sounds are normal.  Musculoskeletal: Normal range of motion.  Neurological: She is alert and oriented to person, place, and time.  Skin: Skin is warm and dry.  Psychiatric: She has a normal mood and affect. Her behavior is normal.  Nursing note and vitals reviewed.    ED Treatments / Results  Labs (all labs ordered are listed, but only abnormal results are displayed) Labs Reviewed  BASIC METABOLIC PANEL - Abnormal; Notable for the following:       Result Value   Potassium 3.4 (*)    Glucose, Bld 142 (*)    All other components within normal limits  CBC - Abnormal; Notable for the following:    Hemoglobin 11.5 (*)    MCH 25.8 (*)    All other components within normal limits  I-STAT TROPOININ, ED    EKG  EKG Interpretation  Date/Time:  Monday May 20 2016 08:20:44 EDT Ventricular Rate:  100 PR Interval:  154 QRS Duration: 86 QT Interval:  362 QTC Calculation: 466 R Axis:   68 Text Interpretation:  Normal sinus rhythm Normal ECG Confirmed by Lacinda Axon  MD, Alecea Trego ([id number]) on 05/20/2016 10:16:14 AM       Radiology Dg Chest 2 View  Result Date:  05/20/2016 CLINICAL DATA:  Chest pain for 3 days EXAM: CHEST  2 VIEW COMPARISON:  12/28/2014 FINDINGS: Normal cardiac silhouette. There is increased bronchitic markings centrally. No focal consolidation. No pneumothorax. IMPRESSION: Increased central bronchitic markings suggest bronchitis. Multifocal pneumonia felt less likely. Recommend follow-up chest radiograph to ensure resolution Electronically Signed   By: Suzy Bouchard M.D.   On: 05/20/2016 09:18    Procedures Procedures (including critical care time)  Medications Ordered in ED Medications  morphine 4 MG/ML injection 4  mg (4 mg Intravenous Given 05/20/16 1059)     Initial Impression / Assessment and Plan / ED Course  I have reviewed the triage vital signs and the nursing notes.  Pertinent labs & imaging results that were available during my care of the patient were reviewed by me and considered in my medical decision making (see chart for details).  Clinical Course    Patient is in no acute distress. Screening labs, troponin, EKG all negative. Chest x-ray suggests a possible bronchitis. Will Rx Zithromax and Diflucan.  Patient has primary care follow-up.  Final Clinical Impressions(s) / ED Diagnoses   Final diagnoses:  Chest pain, unspecified type  Bronchitis    New Prescriptions New Prescriptions   AZITHROMYCIN (ZITHROMAX) 250 MG TABLET    Take 1 tablet (250 mg total) by mouth daily. Take first 2 tablets together, then 1 every day until finished.   FLUCONAZOLE (DIFLUCAN) 200 MG TABLET    Take 1 tablet (200 mg total) by mouth daily.     Nat Christen, MD 05/20/16 [phone]

## 2016-05-20 NOTE — ED Triage Notes (Signed)
Per pt, Pt is coming from home with complaints of SOB and Chest pain that started two days ago. Pt has Hx of sarcoidosis and reports having productive cough with clear/yellow sputum. Pt is in no acute distress at this time.

## 2016-05-20 NOTE — Discharge Instructions (Signed)
EKG and the chemical associated with a heart attack was normal. Will prescribe an antibiotic for bronchitis and Diflucan. Follow-up your primary care doctor.

## 2016-05-20 NOTE — ED Notes (Signed)
Pt is concerned that her pain is a pneumonia or her sarcoidosis/

## 2016-05-21 DIAGNOSIS — I10 Essential (primary) hypertension: Secondary | ICD-10-CM | POA: Diagnosis not present

## 2016-05-21 DIAGNOSIS — F419 Anxiety disorder, unspecified: Secondary | ICD-10-CM | POA: Diagnosis not present

## 2016-05-21 DIAGNOSIS — E1165 Type 2 diabetes mellitus with hyperglycemia: Secondary | ICD-10-CM | POA: Diagnosis not present

## 2016-05-21 DIAGNOSIS — J209 Acute bronchitis, unspecified: Secondary | ICD-10-CM | POA: Diagnosis not present

## 2016-06-18 DIAGNOSIS — R0782 Intercostal pain: Secondary | ICD-10-CM | POA: Diagnosis not present

## 2016-06-18 DIAGNOSIS — Z23 Encounter for immunization: Secondary | ICD-10-CM | POA: Diagnosis not present

## 2016-06-18 DIAGNOSIS — R600 Localized edema: Secondary | ICD-10-CM | POA: Diagnosis not present

## 2016-06-18 DIAGNOSIS — I10 Essential (primary) hypertension: Secondary | ICD-10-CM | POA: Diagnosis not present

## 2016-06-18 DIAGNOSIS — E1165 Type 2 diabetes mellitus with hyperglycemia: Secondary | ICD-10-CM | POA: Diagnosis not present

## 2016-07-12 ENCOUNTER — Encounter (HOSPITAL_COMMUNITY): Payer: Self-pay | Admitting: Emergency Medicine

## 2016-07-12 ENCOUNTER — Emergency Department (HOSPITAL_COMMUNITY): Payer: Medicare Other

## 2016-07-12 DIAGNOSIS — I10 Essential (primary) hypertension: Secondary | ICD-10-CM | POA: Diagnosis not present

## 2016-07-12 DIAGNOSIS — R079 Chest pain, unspecified: Secondary | ICD-10-CM | POA: Diagnosis not present

## 2016-07-12 DIAGNOSIS — J189 Pneumonia, unspecified organism: Secondary | ICD-10-CM | POA: Diagnosis not present

## 2016-07-12 DIAGNOSIS — Z794 Long term (current) use of insulin: Secondary | ICD-10-CM | POA: Diagnosis not present

## 2016-07-12 DIAGNOSIS — R0789 Other chest pain: Secondary | ICD-10-CM | POA: Diagnosis not present

## 2016-07-12 DIAGNOSIS — R0602 Shortness of breath: Secondary | ICD-10-CM | POA: Insufficient documentation

## 2016-07-12 DIAGNOSIS — Z7982 Long term (current) use of aspirin: Secondary | ICD-10-CM | POA: Insufficient documentation

## 2016-07-12 DIAGNOSIS — D860 Sarcoidosis of lung: Secondary | ICD-10-CM | POA: Diagnosis not present

## 2016-07-12 DIAGNOSIS — Z79899 Other long term (current) drug therapy: Secondary | ICD-10-CM | POA: Insufficient documentation

## 2016-07-12 DIAGNOSIS — E119 Type 2 diabetes mellitus without complications: Secondary | ICD-10-CM | POA: Insufficient documentation

## 2016-07-12 DIAGNOSIS — J45909 Unspecified asthma, uncomplicated: Secondary | ICD-10-CM | POA: Insufficient documentation

## 2016-07-12 NOTE — ED Triage Notes (Signed)
C/o pressure to L chest with sob and nausea x 1 week.

## 2016-07-13 ENCOUNTER — Emergency Department (HOSPITAL_COMMUNITY): Payer: Medicare Other

## 2016-07-13 ENCOUNTER — Emergency Department (HOSPITAL_COMMUNITY)
Admission: EM | Admit: 2016-07-13 | Discharge: 2016-07-13 | Disposition: A | Discharge status: Home / Self Care | Payer: Medicare Other | Attending: Emergency Medicine | Admitting: Emergency Medicine

## 2016-07-13 DIAGNOSIS — J189 Pneumonia, unspecified organism: Secondary | ICD-10-CM

## 2016-07-13 DIAGNOSIS — D860 Sarcoidosis of lung: Secondary | ICD-10-CM

## 2016-07-13 DIAGNOSIS — R079 Chest pain, unspecified: Secondary | ICD-10-CM | POA: Diagnosis not present

## 2016-07-13 DIAGNOSIS — R0789 Other chest pain: Secondary | ICD-10-CM

## 2016-07-13 LAB — CBC
HEMATOCRIT: 37.6 % (ref 36.0–46.0)
Hemoglobin: 12 g/dL (ref 12.0–15.0)
MCH: 26.2 pg (ref 26.0–34.0)
MCHC: 31.9 g/dL (ref 30.0–36.0)
MCV: 82.1 fL (ref 78.0–100.0)
Platelets: 271 10*3/uL (ref 150–400)
RBC: 4.58 MIL/uL (ref 3.87–5.11)
RDW: 13.3 % (ref 11.5–15.5)
WBC: 9.9 10*3/uL (ref 4.0–10.5)

## 2016-07-13 LAB — BRAIN NATRIURETIC PEPTIDE: B Natriuretic Peptide: 26.7 pg/mL (ref 0.0–100.0)

## 2016-07-13 LAB — I-STAT TROPONIN, ED
Troponin i, poc: 0 ng/mL (ref 0.00–0.08)
Troponin i, poc: 0 ng/mL (ref 0.00–0.08)

## 2016-07-13 LAB — BASIC METABOLIC PANEL
Anion gap: 10 (ref 5–15)
BUN: 16 mg/dL (ref 6–20)
CO2: 29 mmol/L (ref 22–32)
CREATININE: 1.38 mg/dL — AB (ref 0.44–1.00)
Calcium: 10.3 mg/dL (ref 8.9–10.3)
Chloride: 98 mmol/L — ABNORMAL LOW (ref 101–111)
GFR calc Af Amer: 46 mL/min — ABNORMAL LOW (ref >60)
GFR, EST NON AFRICAN AMERICAN: 40 mL/min — AB (ref >60)
GLUCOSE: 200 mg/dL — AB (ref 65–99)
POTASSIUM: 3.6 mmol/L (ref 3.5–5.1)
Sodium: 137 mmol/L (ref 135–145)

## 2016-07-13 LAB — D-DIMER, QUANTITATIVE: D-Dimer, Quant: 1.15 ug/mL-FEU — ABNORMAL HIGH (ref 0.00–0.50)

## 2016-07-13 MED ORDER — TRAMADOL HCL 50 MG PO TABS: 50.0000 mg | ORAL_TABLET | Freq: Four times a day (QID) | ORAL | 0 refills | Status: DC | PRN

## 2016-07-13 MED ORDER — PREDNISONE 20 MG PO TABS: 40.0000 mg | ORAL_TABLET | Freq: Every day | ORAL | 0 refills | Status: DC

## 2016-07-13 MED ORDER — OXYCODONE-ACETAMINOPHEN 5-325 MG PO TABS
1.0000 | ORAL_TABLET | Freq: Once | ORAL | Status: DC
  Filled 2016-07-13: qty 1

## 2016-07-13 MED ORDER — IBUPROFEN 400 MG PO TABS
400.0000 mg | ORAL_TABLET | Freq: Once | ORAL | Status: AC
  Administered 2016-07-13: 400 mg via ORAL
  Filled 2016-07-13: qty 1

## 2016-07-13 MED ORDER — IOPAMIDOL (ISOVUE-370) INJECTION 76%
INTRAVENOUS | Status: AC
  Administered 2016-07-13: 100 mL
  Filled 2016-07-13: qty 100

## 2016-07-13 MED ORDER — TRAMADOL HCL 50 MG PO TABS
50.0000 mg | ORAL_TABLET | Freq: Once | ORAL | Status: AC
  Administered 2016-07-13: 50 mg via ORAL
  Filled 2016-07-13: qty 1

## 2016-07-13 MED ORDER — FLUCONAZOLE 150 MG PO TABS: 150.0000 mg | ORAL_TABLET | Freq: Once | ORAL | 1 refills | Status: AC

## 2016-07-13 MED ORDER — AZITHROMYCIN 250 MG PO TABS: 250.0000 mg | ORAL_TABLET | Freq: Every day | ORAL | 0 refills | Status: DC

## 2016-07-13 NOTE — ED Provider Notes (Signed)
[facility]-EMERGENCY DEPT Provider Note   CSN: [id number] Arrival date & time: 07/12/16  2330  By signing my name below, I, Levon Hedger, attest that this documentation has been prepared under the direction and in the presence of Gilda Crease, MD . Electronically Signed: Levon Hedger, Scribe. 07/13/2016. 2:50 AM.  History   Chief Complaint Chief Complaint  Patient presents with  . Chest Pain   HPI Cathy Stevenson is a 60 y.o. female with hx of asthma, DM, and sarcoidosis who presents to the Emergency Department complaining of left sided chest pain onset one week ago. Pt describes her pain as pressure and rates it 8/10 in severity. Per pt, this does not feel like asthma. She notes associated back pain, palpitations, leg swelling, nausea and SOB. She has taken Lasix with no relief. No alleviating or modifying factors noted. Pt denies any increase in activity. She denies any fever or chills.  The history is provided by the patient. No language interpreter was used.   Past Medical History:  Diagnosis Date  . Asthma   . Chronic headaches   . DM (diabetes mellitus) (HCC)   . Hyperlipidemia   . OSA (obstructive sleep apnea)    on CPAP   . Sarcoidosis [facility])     Patient Active Problem List   Diagnosis Date Noted  . Dyspnea 01/19/2015  . Upper airway cough syndrome 08/09/2014  . Essential hypertension 08/09/2014  . Sarcoidosis (HCC) 08/08/2014    Past Surgical History:  Procedure Laterality Date  . ABDOMINAL HYSTERECTOMY    . CHOLECYSTECTOMY  1997  . LUNG SURGERY  1998  . TUBAL LIGATION  1978  . VESICOVAGINAL FISTULA CLOSURE W/ TAH  1992    OB History    No data available       Home Medications    Prior to Admission medications   Medication Sig Start Date End Date Taking? Authorizing Provider  albuterol (PROVENTIL) (2.5 MG/3ML) 0.083% nebulizer solution Take 3 mLs (2.5 mg total) by nebulization every 6 (six) hours as needed for wheezing or shortness of breath.  Dx: D86.9 04/17/15  Yes Tammy S Parrett, NP  amitriptyline (ELAVIL) 50 MG tablet Take 50 mg by mouth at bedtime.   Yes Historical Provider, MD  aspirin EC 81 MG tablet Take 81 mg by mouth daily.   Yes Historical Provider, MD  cetirizine (ZYRTEC) 10 MG tablet Take 10 mg by mouth daily as needed for allergies.    Yes Historical Provider, MD  clonazePAM (KLONOPIN) 0.5 MG tablet Take 0.5 mg by mouth at bedtime.    Yes Historical Provider, MD  cyclobenzaprine (FLEXERIL) 10 MG tablet Take 1 tablet (10 mg total) by mouth 2 (two) times daily as needed for muscle spasms. 12/28/14  Yes Kaitlyn Szekalski, PA-C  furosemide (LASIX) 40 MG tablet Take 1 tablet (40 mg total) by mouth daily as needed. Patient taking differently: Take 40 mg by mouth every evening.  09/30/14  Yes Nyoka Cowden, MD  insulin aspart (NOVOLOG) 100 UNIT/ML injection Inject 25 Units into the skin 3 (three) times daily before meals.    Yes Historical Provider, MD  Insulin Glargine (LANTUS SOLOSTAR [place]) Inject 50 Units into the skin 2 (two) times daily.   Yes Historical Provider, MD  valsartan (DIOVAN) 160 MG tablet TAKE 1 TABLET BY MOUTH DAILY 07/24/15  Yes Nyoka Cowden, MD  verapamil (CALAN-SR) 180 MG CR tablet Take 180 mg by mouth daily. 10/25/14  Yes Historical Provider, MD    Family History  Family History  Problem Relation Age of Onset  . Allergies Daughter   . Asthma Son   . Esophageal cancer Brother   . Throat cancer Brother   . Multiple myeloma Brother   . Prostate cancer Brother   . Multiple myeloma Sister     Social History Social History  Substance Use Topics  . Smoking status: Never Smoker  . Smokeless tobacco: Never Used  . Alcohol use No     Allergies   Codeine  Review of Systems Review of Systems 10 systems reviewed and all are negative for acute change except as noted in the HPI.  Physical Exam Updated Vital Signs BP 145/77 (BP Location: Left Arm)   Pulse 97   Temp 98.6 F (37 C) (Oral)   Resp 18    Ht 5' 2.5" (1.588 m)   SpO2 100%   Physical Exam  Constitutional: She is oriented to person, place, and time. She appears well-developed and well-nourished. No distress.  HENT:  Head: Normocephalic and atraumatic.  Right Ear: Hearing normal.  Left Ear: Hearing normal.  Nose: Nose normal.  Mouth/Throat: Oropharynx is clear and moist and mucous membranes are normal.  Eyes: Conjunctivae and EOM are normal. Pupils are equal, round, and reactive to light.  Neck: Normal range of motion. Neck supple.  Cardiovascular: Regular rhythm, S1 normal and S2 normal.  Exam reveals no gallop and no friction rub.   No murmur heard. Pulmonary/Chest: Effort normal and breath sounds normal. No respiratory distress. She exhibits no tenderness.  Abdominal: Soft. Normal appearance and bowel sounds are normal. There is no hepatosplenomegaly. There is no tenderness. There is no rebound, no guarding, no tenderness at McBurney's point and negative Murphy's sign. No hernia.  Musculoskeletal: Normal range of motion.  Neurological: She is alert and oriented to person, place, and time. She has normal strength. No cranial nerve deficit or sensory deficit. Coordination normal. GCS eye subscore is 4. GCS verbal subscore is 5. GCS motor subscore is 6.  Skin: Skin is warm, dry and intact. No rash noted. No cyanosis.  Psychiatric: She has a normal mood and affect. Her speech is normal and behavior is normal. Thought content normal.  Nursing note and vitals reviewed.  ED Treatments / Results  DIAGNOSTIC STUDIES:  Oxygen Saturation is 98% on RA, normal by my interpretation.    COORDINATION OF CARE:  2:48 AM Discussed treatment plan with pt at bedside and pt agreed to plan.   Labs (all labs ordered are listed, but only abnormal results are displayed) Labs Reviewed  BASIC METABOLIC PANEL - Abnormal; Notable for the following:       Result Value   Chloride 98 (*)    Glucose, Bld 200 (*)    Creatinine, Ser 1.38 (*)     GFR calc non Af Amer 40 (*)    GFR calc Af Amer 46 (*)    All other components within normal limits  D-DIMER, QUANTITATIVE (NOT AT [facility]) - Abnormal; Notable for the following:    D-Dimer, Quant 1.15 (*)    All other components within normal limits  CBC  BRAIN NATRIURETIC PEPTIDE  I-STAT TROPOININ, ED  I-STAT TROPOININ, ED    EKG  EKG Interpretation  Date/Time:  Friday July 12 2016 23:35:51 EST Ventricular Rate:  111 PR Interval:  142 QRS Duration: 82 QT Interval:  348 QTC Calculation: 473 R Axis:   61 Text Interpretation:  Sinus tachycardia Otherwise normal ECG Confirmed by Ruthia Person  MD, Addalee Kavanagh [phone]) on  07/13/2016 2:38:17 AM       Radiology Dg Chest 2 View  Result Date: 07/13/2016 CLINICAL DATA:  Left chest pressure with shortness of breath and nausea for 1 week. Diabetes. Nonsmoker. History of asthma. EXAM: CHEST  2 VIEW COMPARISON:  05/20/2016 FINDINGS: Slightly shallow inspiration. Normal heart size and pulmonary vascularity. Linear scarring in the lung apices. No focal airspace disease or consolidation. No blunting of costophrenic angles. No pneumothorax. Mediastinal contours appear intact. Degenerative changes in the spine and shoulders. IMPRESSION: No evidence of active pulmonary disease. Chronic linear scarring in the upper lungs. Electronically Signed   By: Lucienne Capers M.D.   On: 07/13/2016 00:00   Ct Angio Chest Pe W Or Wo Contrast  Result Date: 07/13/2016 CLINICAL DATA:  Chest pain with elevated D-dimer. History of asthma, sarcoidosis, lung surgery. EXAM: CT ANGIOGRAPHY CHEST WITH CONTRAST TECHNIQUE: Multidetector CT imaging of the chest was performed using the standard protocol during bolus administration of intravenous contrast. Multiplanar CT image reconstructions and MIPs were obtained to evaluate the vascular anatomy. CONTRAST:  80 mL Isovue 370 COMPARISON:  None. FINDINGS: Cardiovascular: Satisfactory opacification of the pulmonary arteries to the  segmental level. No evidence of pulmonary embolism. Normal heart size. No pericardial effusion. Mediastinum/Nodes: No enlarged mediastinal, hilar, or axillary lymph nodes. Thyroid gland, trachea, and esophagus demonstrate no significant findings. Lungs/Pleura: Evaluation is limited due to motion artifact. There is a mild mosaic pattern to the lungs which could be due to motion artifact but may also indicate edema or bronchopneumonia. Scarring in the upper lungs and lung apices. This is likely related to the patient's history of sarcoidosis. No pleural effusions. No pneumothorax. Airways are patent. Surgical clips in the left lung base. Upper Abdomen: Configuration of the liver suggests cirrhosis. The lateral segment of the left lobe and caudate lobe are enlarged. Liver contour is nodular. Spleen is not enlarged. Gallbladder is surgically absent. Musculoskeletal: Degenerative changes throughout the spine. No destructive bone lesions. Review of the MIP images confirms the above findings. IMPRESSION: No evidence of significant pulmonary embolus. Scarring in the upper lungs likely related to sarcoidosis. Mosaic pattern to the lung bases may represent edema, bronchopneumonia or artifact from respiratory motion. 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 Electronically Signed   By: Lucienne Capers M.D.   On: 07/13/2016 05:37    Procedures Procedures (including critical care time)  Medications Ordered in ED Medications  oxyCODONE-acetaminophen (PERCOCET/ROXICET) 5-325 MG per tablet 1 tablet (1 tablet Oral Not Given 07/13/16 0336)  ibuprofen (ADVIL,MOTRIN) tablet 400 mg (400 mg Oral Given 07/13/16 0335)  iopamidol (ISOVUE-370) 76 % injection (100 mLs  Contrast Given 07/13/16 0507)     Initial Impression / Assessment and Plan / ED Course  I have reviewed the triage vital signs and the nursing notes.  Pertinent labs & imaging results that were available during my care of the patient were reviewed by me and considered in my  medical decision making (see chart for details).  Clinical Course   Patient presented to the emergency department with complaints of left-sided chest pain. Patient reports that the pain has been present for approximately 1 week. Patient was noted to be tachycardic. She does, however, report that this has been chronic for her. She recently had her verapamil dose decreased. This is likely affecting her heart rate. Patient did not seem consistent with cardiac etiology. EKG does not show evidence of ischemia or infarct. Troponin was negative 2. Patient did, however, have an elevated d-dimer. It was felt that the patient  would require CT angiography to rule out pulmonary embolism. This was performed and did not show any evidence of pulmonary embolism. There is the possibility of pneumonia. There is also evidence of known pulmonary sarcoid. Based on this, patient will be treated with analgesia, prednisone, Zithromax. Follow-up with primary care. Return for worsening symptoms.  Final Clinical Impressions(s) / ED Diagnoses   Final diagnoses:  Chest wall pain  Community acquired pneumonia, unspecified laterality  Pulmonary sarcoidosis ([place])    New Prescriptions New Prescriptions   No medications on file  I personally performed the services described in this documentation, which was scribed in my presence. The recorded information has been reviewed and is accurate.    Orpah Greek, MD 07/13/16 [phone]

## 2016-07-13 NOTE — ED Notes (Signed)
Dr. Betsey Holiday back at the bedside.

## 2016-07-24 DIAGNOSIS — R0602 Shortness of breath: Secondary | ICD-10-CM | POA: Diagnosis not present

## 2016-07-24 DIAGNOSIS — E7800 Pure hypercholesterolemia, unspecified: Secondary | ICD-10-CM | POA: Diagnosis not present

## 2016-07-24 DIAGNOSIS — E1165 Type 2 diabetes mellitus with hyperglycemia: Secondary | ICD-10-CM | POA: Diagnosis not present

## 2016-07-24 DIAGNOSIS — R002 Palpitations: Secondary | ICD-10-CM | POA: Diagnosis not present

## 2016-07-24 DIAGNOSIS — I10 Essential (primary) hypertension: Secondary | ICD-10-CM | POA: Diagnosis not present

## 2016-07-29 DIAGNOSIS — R0601 Orthopnea: Secondary | ICD-10-CM | POA: Diagnosis not present

## 2016-07-29 DIAGNOSIS — R002 Palpitations: Secondary | ICD-10-CM | POA: Diagnosis not present

## 2016-07-29 DIAGNOSIS — R0789 Other chest pain: Secondary | ICD-10-CM | POA: Diagnosis not present

## 2016-08-07 DIAGNOSIS — R0602 Shortness of breath: Secondary | ICD-10-CM | POA: Diagnosis not present

## 2016-08-07 DIAGNOSIS — R079 Chest pain, unspecified: Secondary | ICD-10-CM | POA: Diagnosis not present

## 2016-08-07 DIAGNOSIS — R002 Palpitations: Secondary | ICD-10-CM | POA: Diagnosis not present

## 2016-08-07 DIAGNOSIS — R0989 Other specified symptoms and signs involving the circulatory and respiratory systems: Secondary | ICD-10-CM | POA: Diagnosis not present

## 2016-09-23 DIAGNOSIS — E784 Other hyperlipidemia: Secondary | ICD-10-CM | POA: Diagnosis not present

## 2016-09-23 DIAGNOSIS — E1165 Type 2 diabetes mellitus with hyperglycemia: Secondary | ICD-10-CM | POA: Diagnosis not present

## 2016-09-23 DIAGNOSIS — M545 Low back pain: Secondary | ICD-10-CM | POA: Diagnosis not present

## 2016-09-23 DIAGNOSIS — D869 Sarcoidosis, unspecified: Secondary | ICD-10-CM | POA: Diagnosis not present

## 2016-09-23 DIAGNOSIS — I10 Essential (primary) hypertension: Secondary | ICD-10-CM | POA: Diagnosis not present

## 2016-10-02 ENCOUNTER — Emergency Department (HOSPITAL_COMMUNITY)
Admission: EM | Admit: 2016-10-02 | Discharge: 2016-10-03 | Disposition: A | Discharge status: Home / Self Care | Payer: Medicare HMO | Attending: Emergency Medicine | Admitting: Emergency Medicine

## 2016-10-02 ENCOUNTER — Encounter (HOSPITAL_COMMUNITY): Payer: Self-pay | Admitting: *Deleted

## 2016-10-02 ENCOUNTER — Emergency Department (HOSPITAL_COMMUNITY): Payer: Medicare HMO

## 2016-10-02 DIAGNOSIS — M545 Low back pain: Secondary | ICD-10-CM | POA: Diagnosis not present

## 2016-10-02 DIAGNOSIS — D869 Sarcoidosis, unspecified: Secondary | ICD-10-CM | POA: Diagnosis not present

## 2016-10-02 DIAGNOSIS — Z79899 Other long term (current) drug therapy: Secondary | ICD-10-CM | POA: Insufficient documentation

## 2016-10-02 DIAGNOSIS — G8929 Other chronic pain: Secondary | ICD-10-CM | POA: Insufficient documentation

## 2016-10-02 DIAGNOSIS — R1013 Epigastric pain: Secondary | ICD-10-CM | POA: Insufficient documentation

## 2016-10-02 DIAGNOSIS — E119 Type 2 diabetes mellitus without complications: Secondary | ICD-10-CM | POA: Insufficient documentation

## 2016-10-02 DIAGNOSIS — Z7982 Long term (current) use of aspirin: Secondary | ICD-10-CM | POA: Diagnosis not present

## 2016-10-02 DIAGNOSIS — Z794 Long term (current) use of insulin: Secondary | ICD-10-CM | POA: Insufficient documentation

## 2016-10-02 DIAGNOSIS — R0602 Shortness of breath: Secondary | ICD-10-CM

## 2016-10-02 DIAGNOSIS — J45909 Unspecified asthma, uncomplicated: Secondary | ICD-10-CM | POA: Insufficient documentation

## 2016-10-02 DIAGNOSIS — I10 Essential (primary) hypertension: Secondary | ICD-10-CM | POA: Diagnosis not present

## 2016-10-02 DIAGNOSIS — M5441 Lumbago with sciatica, right side: Secondary | ICD-10-CM | POA: Diagnosis not present

## 2016-10-02 DIAGNOSIS — R079 Chest pain, unspecified: Secondary | ICD-10-CM | POA: Diagnosis not present

## 2016-10-02 LAB — CBC
HCT: 33 % — ABNORMAL LOW (ref 36.0–46.0)
Hemoglobin: 10.4 g/dL — ABNORMAL LOW (ref 12.0–15.0)
MCH: 25.9 pg — ABNORMAL LOW (ref 26.0–34.0)
MCHC: 31.5 g/dL (ref 30.0–36.0)
MCV: 82.1 fL (ref 78.0–100.0)
PLATELETS: 218 10*3/uL (ref 150–400)
RBC: 4.02 MIL/uL (ref 3.87–5.11)
RDW: 12.9 % (ref 11.5–15.5)
WBC: 7.9 10*3/uL (ref 4.0–10.5)

## 2016-10-02 LAB — BASIC METABOLIC PANEL
Anion gap: 8 (ref 5–15)
BUN: 16 mg/dL (ref 6–20)
CALCIUM: 9.6 mg/dL (ref 8.9–10.3)
CO2: 25 mmol/L (ref 22–32)
CREATININE: 0.94 mg/dL (ref 0.44–1.00)
Chloride: 102 mmol/L (ref 101–111)
Glucose, Bld: 259 mg/dL — ABNORMAL HIGH (ref 65–99)
Potassium: 3.9 mmol/L (ref 3.5–5.1)
SODIUM: 135 mmol/L (ref 135–145)

## 2016-10-02 LAB — TROPONIN I

## 2016-10-02 NOTE — ED Provider Notes (Signed)
[facility] DEPT Provider Note   CSN: [id number] Arrival date & time: 10/02/16  2023     History   Chief Complaint Chief Complaint  Patient presents with  . Back Pain  . Chest Pain    HPI Cathy Stevenson is a 61 y.o. female with history of sarcoidosis, asthma, diabetes who presents with a 3 month history of low back pain. Patient states her low back pain worsened 3-4 days ago. She states that her pain is worse with standing and walking. She states that she has had some radiation of pain to her legs, but none at this time.  She describes associated flank and bilateral abdominal spasms due to her back pain. She has been seen by her primary care provider who gave her muscle relaxers for her back pain which is not relieved it. She believes that her back pain started after she was given IV contrast for her CT chest angiogram in November. She denies numbness or tingling of her legs, saddle anesthesia, loss of bowel or bladder control, recent surgeries, cancer, fevers, history of IVDU. Patient states she has a baseline burning chest pain and shortness of breath. She has had this since October, she believes due to her sarcoidosis. She denies any shortness of breath this time. Patient also reports a four-day history of a "knot" in her anterior neck. It is painless. Patient states she has been on Synthroid in the past, however was discontinued.  HPI  Past Medical History:  Diagnosis Date  . Asthma   . Chronic headaches   . DM (diabetes mellitus) ([place])   . Hyperlipidemia   . OSA (obstructive sleep apnea)    on CPAP   . Sarcoidosis [facility])     Patient Active Problem List   Diagnosis Date Noted  . Dyspnea 01/19/2015  . Upper airway cough syndrome 08/09/2014  . Essential hypertension 08/09/2014  . Sarcoidosis ([place]) 08/08/2014    Past Surgical History:  Procedure Laterality Date  . ABDOMINAL HYSTERECTOMY    . CHOLECYSTECTOMY  1997  . LUNG SURGERY  1998  . TUBAL LIGATION  1978  .  VESICOVAGINAL FISTULA CLOSURE W/ TAH  1992    OB History    No data available       Home Medications    Prior to Admission medications   Medication Sig Start Date End Date Taking? Authorizing Provider  albuterol (PROVENTIL) (2.5 MG/3ML) 0.083% nebulizer solution Take 3 mLs (2.5 mg total) by nebulization every 6 (six) hours as needed for wheezing or shortness of breath. Dx: D86.9 04/17/15   Tammy S Parrett, NP  amitriptyline (ELAVIL) 50 MG tablet Take 50 mg by mouth at bedtime.    Historical Provider, MD  aspirin EC 81 MG tablet Take 81 mg by mouth daily.    Historical Provider, MD  azithromycin (ZITHROMAX) 250 MG tablet Take 1 tablet (250 mg total) by mouth daily. Take first 2 tablets together, then 1 every day until finished. 07/13/16   Orpah Greek, MD  cetirizine (ZYRTEC) 10 MG tablet Take 10 mg by mouth daily as needed for allergies.     Historical Provider, MD  clonazePAM (KLONOPIN) 0.5 MG tablet Take 0.5 mg by mouth at bedtime.     Historical Provider, MD  cyclobenzaprine (FLEXERIL) 10 MG tablet Take 1 tablet (10 mg total) by mouth 2 (two) times daily as needed for muscle spasms. 12/28/14   Kaitlyn Szekalski, PA-C  furosemide (LASIX) 40 MG tablet Take 1 tablet (40 mg total) by mouth daily  as needed. Patient taking differently: Take 40 mg by mouth every evening.  09/30/14   Tanda Rockers, MD  insulin aspart (NOVOLOG) 100 UNIT/ML injection Inject 25 Units into the skin 3 (three) times daily before meals.     Historical Provider, MD  Insulin Glargine (LANTUS SOLOSTAR [place]) Inject 50 Units into the skin 2 (two) times daily.    Historical Provider, MD  predniSONE (DELTASONE) 20 MG tablet Take 2 tablets (40 mg total) by mouth daily with breakfast. 07/13/16   Orpah Greek, MD  traMADol (ULTRAM) 50 MG tablet Take 1 tablet (50 mg total) by mouth every 6 (six) hours as needed. 07/13/16   Orpah Greek, MD  valsartan (DIOVAN) 160 MG tablet TAKE 1 TABLET BY MOUTH DAILY  07/24/15   Tanda Rockers, MD  verapamil (CALAN-SR) 180 MG CR tablet Take 180 mg by mouth daily. 10/25/14   Historical Provider, MD    Family History Family History  Problem Relation Age of Onset  . Allergies Daughter   . Asthma Son   . Esophageal cancer Brother   . Throat cancer Brother   . Multiple myeloma Brother   . Prostate cancer Brother   . Multiple myeloma Sister     Social History Social History  Substance Use Topics  . Smoking status: Never Smoker  . Smokeless tobacco: Never Used  . Alcohol use No     Allergies   Codeine   Review of Systems Review of Systems  Constitutional: Negative for chills and fever.  HENT: Negative for facial swelling and sore throat.   Respiratory: Positive for shortness of breath.   Cardiovascular: Positive for chest pain.  Gastrointestinal: Positive for abdominal pain ("spasms"). Negative for nausea and vomiting.  Genitourinary: Negative for dysuria.  Musculoskeletal: Positive for back pain.  Skin: Negative for rash and wound.  Neurological: Negative for headaches.  Psychiatric/Behavioral: The patient is not nervous/anxious.      Physical Exam Updated Vital Signs BP 145/78   Pulse 109   Temp 98.2 F (36.8 C) (Oral)   Resp 20   Wt 102.1 kg   SpO2 99%   BMI 40.50 kg/m   Physical Exam  Constitutional: She appears well-developed and well-nourished. No distress.  HENT:  Head: Normocephalic and atraumatic.  Mouth/Throat: Oropharynx is clear and moist. No oropharyngeal exudate.  Eyes: Conjunctivae are normal. Pupils are equal, round, and reactive to light. Right eye exhibits no discharge. Left eye exhibits no discharge. No scleral icterus.  Neck: Normal range of motion. Neck supple. No thyromegaly present.  Cardiovascular: Normal rate, regular rhythm, normal heart sounds and intact distal pulses.  Exam reveals no gallop and no friction rub.   No murmur heard. Pulmonary/Chest: Effort normal and breath sounds normal. No stridor.  No respiratory distress. She has no wheezes. She has no rales.  Abdominal: Soft. Bowel sounds are normal. She exhibits no distension. There is tenderness. There is no rebound and no guarding.    Musculoskeletal: She exhibits no edema.       Lumbar back: She exhibits tenderness. She exhibits no bony tenderness.       Back:  Lymphadenopathy:    She has no cervical adenopathy.  Neurological: She is alert. Coordination normal.  Normal sensation and 5/5 strength in lower extremities  Skin: Skin is warm and dry. No rash noted. She is not diaphoretic. No pallor.  Psychiatric: She has a normal mood and affect.  Nursing note and vitals reviewed.    ED Treatments /  Results  Labs (all labs ordered are listed, but only abnormal results are displayed) Labs Reviewed  BASIC METABOLIC PANEL - Abnormal; Notable for the following:       Result Value   Glucose, Bld 259 (*)    All other components within normal limits  CBC - Abnormal; Notable for the following:    Hemoglobin 10.4 (*)    HCT 33.0 (*)    MCH 25.9 (*)    All other components within normal limits  TROPONIN I    EKG  EKG Interpretation None       Radiology Dg Chest 2 View  Result Date: 10/02/2016 CLINICAL DATA:  Chest pain and shortness of breath EXAM: CHEST  2 VIEW COMPARISON:  Chest CT 07/13/2016 FINDINGS: Shallow lung inflation. Suture line noted overlying the left lower lobe. No focal airspace consolidation or pulmonary edema. There is bilateral upper lobe scarring. No pleural effusion or pneumothorax. IMPRESSION: No active cardiopulmonary disease. Electronically Signed   By: Ulyses Jarred M.D.   On: 10/02/2016 21:35    Procedures Procedures (including critical care time)  Medications Ordered in ED Medications - No data to display   Initial Impression / Assessment and Plan / ED Course  I have reviewed the triage vital signs and the nursing notes.  Pertinent labs & imaging results that were available during my care  of the patient were reviewed by me and considered in my medical decision making (see chart for details).  Clinical Course as of Oct 04 123  Thu Oct 03, 2016  0059 Plan: CT abd/pelvis looking for a pancreatic mass.  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 BMP shows glucose 259. Lipase 35. Delta troponin negative. CXR shows no active cardiopulmonary disease. Lumbar xray Shows degenerative changes of the lumbar spine; no acute displaced fractures identified. EKG shows sinus tachycardia, patient does have known chronic tachycardia. At shift change, patient care transferred to [facility], PA-C for continued evaluation, follow up of CT abdomen pelvis and determination of disposition.     Final Clinical Impressions(s) / ED Diagnoses   Final diagnoses:  None    New Prescriptions New Prescriptions   No medications on file     Frederica Kuster, PA-C 10/03/16 0128    Rolland Porter, MD 10/03/16 [phone]

## 2016-10-02 NOTE — ED Triage Notes (Signed)
The pt is c/o lower back pain difficulty walking chest pain sob.  No sob at present.  All these symptoms since october

## 2016-10-03 ENCOUNTER — Emergency Department (HOSPITAL_COMMUNITY): Payer: Medicare HMO

## 2016-10-03 DIAGNOSIS — G8929 Other chronic pain: Secondary | ICD-10-CM | POA: Diagnosis not present

## 2016-10-03 DIAGNOSIS — M545 Low back pain: Secondary | ICD-10-CM | POA: Diagnosis not present

## 2016-10-03 LAB — LIPASE, BLOOD: Lipase: 35 U/L (ref 11–51)

## 2016-10-03 LAB — TROPONIN I: Troponin I: <0.03 ng/mL (ref <0.03)

## 2016-10-03 MED ORDER — METHOCARBAMOL 500 MG PO TABS
500.0000 mg | ORAL_TABLET | Freq: Once | ORAL | Status: AC
  Administered 2016-10-03: 500 mg via ORAL
  Filled 2016-10-03: qty 1

## 2016-10-03 MED ORDER — NAPROXEN 500 MG PO TABS: 500.0000 mg | ORAL_TABLET | Freq: Two times a day (BID) | ORAL | 0 refills | Status: DC

## 2016-10-03 MED ORDER — METHOCARBAMOL 500 MG PO TABS: 500.0000 mg | ORAL_TABLET | Freq: Two times a day (BID) | ORAL | 0 refills | Status: DC

## 2016-10-03 NOTE — Discharge Instructions (Signed)
1. Medications: robaxin, naproxen, usual home medications; stop taking Flexeril 2. Treatment: rest, drink plenty of fluids,  3. Follow Up: Please followup with your primary doctor in 3-7 days for discussion of your diagnoses and further evaluation after today's visit; if you do not have a primary care doctor use the resource guide provided to find one; Please return to the ER for years, worsening symptoms or other concerns.

## 2016-10-03 NOTE — ED Provider Notes (Signed)
Care assumed from Armstead Peaks, PA-C.  Cathy Stevenson is a 61 y.o. female presents with low back pain and abd pain onset Nov after CT angio.  Here today for worsening of her chronic symptoms and worsening back pain.  Pt with radiation down the bilateral legs.  Paraspinal pain; no midline pain.  Hx of sarcoid and pt with serum lipema during last visit.  Hx of chronic tachycardia.    Physical Exam  BP 145/78   Pulse 109   Temp 98.2 F (36.8 C) (Oral)   Resp 20   Wt 102.1 kg   SpO2 99%   BMI 40.50 kg/m   Physical Exam  Constitutional: She appears well-developed and well-nourished. No distress.  HENT:  Head: Normocephalic.  Eyes: Conjunctivae are normal. No scleral icterus.  Neck: Normal range of motion.  Cardiovascular: Normal rate and intact distal pulses.   Pulmonary/Chest: Effort normal.  Musculoskeletal: Normal range of motion.  Tender to palpation over the bilateral SI joints  Neurological: She is alert.  Skin: Skin is warm and dry.    Results for orders placed or performed during the hospital encounter of [id number]  Basic metabolic panel  Result Value Ref Range   Sodium 135 135 - 145 mmol/L   Potassium 3.9 3.5 - 5.1 mmol/L   Chloride 102 101 - 111 mmol/L   CO2 25 22 - 32 mmol/L   Glucose, Bld 259 (H) 65 - 99 mg/dL   BUN 16 6 - 20 mg/dL   Creatinine, Ser 0.94 0.44 - 1.00 mg/dL   Calcium 9.6 8.9 - 10.3 mg/dL   GFR calc non Af Amer >60 >60 mL/min   GFR calc Af Amer >60 >60 mL/min   Anion gap 8 5 - 15  CBC  Result Value Ref Range   WBC 7.9 4.0 - 10.5 K/uL   RBC 4.02 3.87 - 5.11 MIL/uL   Hemoglobin 10.4 (L) 12.0 - 15.0 g/dL   HCT 33.0 (L) 36.0 - 46.0 %   MCV 82.1 78.0 - 100.0 fL   MCH 25.9 (L) 26.0 - 34.0 pg   MCHC 31.5 30.0 - 36.0 g/dL   RDW 12.9 11.5 - 15.5 %   Platelets 218 150 - 400 K/uL  Troponin I  Result Value Ref Range   Troponin I <0.03 <0.03 ng/mL  Troponin I  Result Value Ref Range   Troponin I <0.03 <0.03 ng/mL  Lipase, blood  Result Value Ref Range    Lipase 35 11 - 51 U/L   Ct Abdomen Pelvis Wo Contrast  Result Date: 10/03/2016 CLINICAL DATA:  Low back pain, difficulty walking, chest pain, and shortness of breath. Symptoms since October. EXAM: CT ABDOMEN AND PELVIS WITHOUT CONTRAST TECHNIQUE: Multidetector CT imaging of the abdomen and pelvis was performed following the standard protocol without IV contrast. COMPARISON:  CT chest 07/13/2016 FINDINGS: Lower chest: Calcified granuloma in the left lung base. Calcified lymph node adjacent to the esophagus. Lungs are otherwise clear. Hepatobiliary: Probable ectatic cirrhosis with enlarged and lobular contour of the lateral segment left lobe of liver. No focal lesions are identified on noncontrast imaging. Surgical absence of the gallbladder. No bile duct dilatation. Pancreas: Unremarkable. No pancreatic ductal dilatation or surrounding inflammatory changes. Spleen: Normal in size without focal abnormality. Adrenals/Urinary Tract: Adrenal glands are unremarkable. Kidneys are normal, without renal calculi, focal lesion, or hydronephrosis. Bladder is unremarkable. Stomach/Bowel: Stomach is within normal limits. Appendix appears normal. No evidence of bowel wall thickening, distention, or inflammatory changes. Vascular/Lymphatic: Aortic atherosclerosis. No  enlarged abdominal or pelvic lymph nodes. Reproductive: Status post hysterectomy. No adnexal masses. Other: No abdominal wall hernia or abnormality. No abdominopelvic ascites. Musculoskeletal: Degenerative changes in the lumbar spine. Mild anterior subluxation of L3 on L4, likely degenerative. No vertebral compression deformities. Degenerative changes in the hips. IMPRESSION: No acute process demonstrated in the abdomen or pelvis on noncontrast imaging. Calcified granulomas in the lung base. Hepatic cirrhosis. Degenerative changes in the lumbar spine. Electronically Signed   By: Lucienne Capers M.D.   On: 10/03/2016 02:28   Dg Chest 2 View  Result Date:  10/02/2016 CLINICAL DATA:  Chest pain and shortness of breath EXAM: CHEST  2 VIEW COMPARISON:  Chest CT 07/13/2016 FINDINGS: Shallow lung inflation. Suture line noted overlying the left lower lobe. No focal airspace consolidation or pulmonary edema. There is bilateral upper lobe scarring. No pleural effusion or pneumothorax. IMPRESSION: No active cardiopulmonary disease. Electronically Signed   By: Ulyses Jarred M.D.   On: 10/02/2016 21:35   Dg Lumbar Spine Complete  Result Date: 10/03/2016 CLINICAL DATA:  Low back pain for 3 months. Pain in the kidney areas since a CT scan. No injury. EXAM: LUMBAR SPINE - COMPLETE 4+ VIEW COMPARISON:  None. FINDINGS: Five lumbar type vertebral bodies with partial sacralization at L5. Diffuse degenerative changes throughout the lumbar spine with narrowed interspaces and endplate hypertrophic changes. Bridging anterior osteophytes noted in the lower thoracic spine. Degenerative changes in the lower lumbar facet joints. Slight anterior subluxation of L3 on L4 is likely degenerative. No vertebral compression deformities. No focal bone lesion or bone destruction. Visualized sacrum appears intact. IMPRESSION: Degenerative changes in the lumbar spine. No acute displaced fractures identified. Electronically Signed   By: Lucienne Capers M.D.   On: 10/03/2016 00:24     ED Course  Procedures   Clinical Course as of Oct 03 326  Thu Oct 03, 2016  0059 Plan: CT abd/pelvis looking for a pancreatic mass.  [HM]  0301 Degenerative changes in the lumbar spine. No acute displaced fractures identified.  DG Lumbar Spine Complete [HM]  0302 No acute process demonstrated in the abdomen or pelvis on noncontrast imaging. Calcified granulomas in the lung base. Hepatic cirrhosis. Degenerative changes in the lumbar spine.   CT Abdomen Pelvis Wo Contrast [HM]  0302 Mild anemia Hemoglobin: (!) 10.4 [HM]  0302 Normal Lipase: 35 [HM]  0303 Normal Troponin I: <0.03 [HM]  0313 The patient  was discussed with and seen by Dr. Rolland Porter who agrees with the treatment plan.   [HM]    Clinical Course User Index [HM] Abigail Butts, PA-C    MDM  Pt With chronic bilateral low back pain, epigastric abdominal pain and shortness of breath. Symptoms are all chronic and have been worsening over the last several months. CT scan of the abdomen is reassuring. L-spine plain films are without evidence of acute fracture. Tenderness to palpation is over the bilateral SI joints. EKG and troponin also reassuring. No pancreatic mass noted. Patient will be referred back to her pulmonologist and PCP. She has also been referred to orthopedics and gastroenterology. She is currently taking a PPI and famotidine. Recommended she continue these. Will discontinue Flexeril, add Robaxin and naproxen. Long discussion about results with patient and daughter. They state understanding and are in agreement with the plan.  Chronic bilateral low back pain, with sciatica presence unspecified  Epigastric abdominal pain  Shortness of breath  [facility])        Abigail Butts, PA-C 10/03/16 [phone]  Rolland Porter, MD 10/03/16 [phone]

## 2016-10-03 NOTE — ED Provider Notes (Signed)
Patient reports a lot of painful area since she had a CT scan done with contrast in November. She reports pain in her lower back and diffuse cramping abdominal pain. She also states her legs are burning. She states if she lays down or sits up she doesn't have discomfort, however she stands up her back starts hurting. She describes her abdominal pain as spasms. She was seen by her PCP last week and placed on Flexeril without improvement.    On exam she is tender to her SI joints bilaterally and over her sacrum. Her lumbar and thoracic spine are nontender. Patient also has a smooth mass in the submandibular area that she noticed a few days ago, it is nontender and does not change in size with eating.  Medical screening examination/treatment/procedure(s) were conducted as a shared visit with non-physician practitioner(s) and myself.  I personally evaluated the patient during the encounter.   EKG Interpretation None       Rolland Porter, MD, Barbette Or, MD 10/03/16 [phone]

## 2016-10-12 ENCOUNTER — Emergency Department (HOSPITAL_COMMUNITY): Payer: Medicare HMO

## 2016-10-12 ENCOUNTER — Emergency Department (HOSPITAL_COMMUNITY)
Admission: EM | Admit: 2016-10-12 | Discharge: 2016-10-12 | Disposition: A | Discharge status: Home / Self Care | Payer: Medicare HMO | Attending: Emergency Medicine | Admitting: Emergency Medicine

## 2016-10-12 ENCOUNTER — Encounter (HOSPITAL_COMMUNITY): Payer: Self-pay | Admitting: Vascular Surgery

## 2016-10-12 DIAGNOSIS — M79672 Pain in left foot: Secondary | ICD-10-CM

## 2016-10-12 DIAGNOSIS — E119 Type 2 diabetes mellitus without complications: Secondary | ICD-10-CM | POA: Insufficient documentation

## 2016-10-12 DIAGNOSIS — I10 Essential (primary) hypertension: Secondary | ICD-10-CM | POA: Insufficient documentation

## 2016-10-12 DIAGNOSIS — Z79899 Other long term (current) drug therapy: Secondary | ICD-10-CM | POA: Diagnosis not present

## 2016-10-12 DIAGNOSIS — Z7982 Long term (current) use of aspirin: Secondary | ICD-10-CM | POA: Insufficient documentation

## 2016-10-12 DIAGNOSIS — Z794 Long term (current) use of insulin: Secondary | ICD-10-CM | POA: Diagnosis not present

## 2016-10-12 DIAGNOSIS — M25572 Pain in left ankle and joints of left foot: Secondary | ICD-10-CM | POA: Diagnosis not present

## 2016-10-12 DIAGNOSIS — J45909 Unspecified asthma, uncomplicated: Secondary | ICD-10-CM | POA: Insufficient documentation

## 2016-10-12 MED ORDER — HYDROCODONE-ACETAMINOPHEN 5-325 MG PO TABS
1.0000 | ORAL_TABLET | Freq: Once | ORAL | Status: AC
  Administered 2016-10-12: 1 via ORAL
  Filled 2016-10-12: qty 1

## 2016-10-12 NOTE — ED Provider Notes (Signed)
[facility] DEPT Provider Note   CSN: [id number] Arrival date & time: 10/12/16  3474     History   Chief Complaint Chief Complaint  Patient presents with  . Leg Pain    HPI Cathy Stevenson is a 61 y.o. female.  HPI  61 year old female with a history of diabetes, hyperlipidemia, obstructive sleep apnea, sarcoidosis, asthma presents today with complaints of left ankle pain.  Patient notes symptoms started yesterday with tenderness and pain with ambulation to the left ankle.  She notes minor amount of swelling and pain down into the proximal foot.  She denies any redness, notes some warmth. Right ankle normal.   Patient notes she has been taking Aleve, aspirin, and muscle relaxers at home with no significant improvement in her pain.  Patient notes that she often has "flares" similar to this, but usually not as severe.  Denies any fever, nausea vomiting.  No recent infections.   Patient notes a history of ankle fracture in the past requiring surgical intervention.  Patient is not taking any immunosuppressive drugs for her sarcoidosis.   Past Medical History:  Diagnosis Date  . Asthma   . Chronic headaches   . DM (diabetes mellitus) ([place])   . Hyperlipidemia   . OSA (obstructive sleep apnea)    on CPAP   . Sarcoidosis [facility])     Patient Active Problem List   Diagnosis Date Noted  . Dyspnea 01/19/2015  . Upper airway cough syndrome 08/09/2014  . Essential hypertension 08/09/2014  . Sarcoidosis ([place]) 08/08/2014    Past Surgical History:  Procedure Laterality Date  . ABDOMINAL HYSTERECTOMY    . CHOLECYSTECTOMY  1997  . LUNG SURGERY  1998  . TUBAL LIGATION  1978  . VESICOVAGINAL FISTULA CLOSURE W/ TAH  1992    OB History    No data available       Home Medications    Prior to Admission medications   Medication Sig Start Date End Date Taking? Authorizing Provider  albuterol (PROVENTIL) (2.5 MG/3ML) 0.083% nebulizer solution Take 3 mLs (2.5 mg total) by  nebulization every 6 (six) hours as needed for wheezing or shortness of breath. Dx: D86.9 04/17/15  Yes Tammy S Parrett, NP  amitriptyline (ELAVIL) 50 MG tablet Take 50 mg by mouth at bedtime.   Yes Historical Provider, MD  aspirin EC 81 MG tablet Take 81 mg by mouth daily.   Yes Historical Provider, MD  cetirizine (ZYRTEC) 10 MG tablet Take 10 mg by mouth daily as needed for allergies.    Yes Historical Provider, MD  clonazePAM (KLONOPIN) 0.5 MG tablet Take 0.5 mg by mouth at bedtime.    Yes Historical Provider, MD  furosemide (LASIX) 40 MG tablet Take 1 tablet (40 mg total) by mouth daily as needed. Patient taking differently: Take 40 mg by mouth every evening.  09/30/14  Yes Tanda Rockers, MD  insulin aspart (NOVOLOG) 100 UNIT/ML injection Inject 30 Units into the skin 3 (three) times daily before meals.    Yes Historical Provider, MD  Insulin Glargine (LANTUS SOLOSTAR [place]) Inject 50 Units into the skin 2 (two) times daily.   Yes Historical Provider, MD  methocarbamol (ROBAXIN) 500 MG tablet Take 1 tablet (500 mg total) by mouth 2 (two) times daily. 10/03/16  Yes Hannah Muthersbaugh, PA-C  naproxen (NAPROSYN) 500 MG tablet Take 1 tablet (500 mg total) by mouth 2 (two) times daily with a meal. 10/03/16  Yes Hannah Muthersbaugh, PA-C  omega-3 acid ethyl esters (LOVAZA) 1  g capsule Take 2 g by mouth 2 (two) times daily. 09/26/16  Yes Historical Provider, MD  SYMBICORT 160-4.5 MCG/ACT inhaler Take 2 puffs by mouth 2 (two) times daily. 09/26/16  Yes Historical Provider, MD  valsartan (DIOVAN) 160 MG tablet TAKE 1 TABLET BY MOUTH DAILY 07/24/15  Yes Tanda Rockers, MD  verapamil (CALAN-SR) 180 MG CR tablet Take 180 mg by mouth daily. 10/25/14  Yes Historical Provider, MD  traMADol (ULTRAM) 50 MG tablet Take 1 tablet (50 mg total) by mouth every 6 (six) hours as needed. Patient not taking: Reported on 10/12/2016 07/13/16   Orpah Greek, MD    Family History Family History  Problem Relation Age of Onset   . Allergies Daughter   . Asthma Son   . Esophageal cancer Brother   . Throat cancer Brother   . Multiple myeloma Brother   . Prostate cancer Brother   . Multiple myeloma Sister     Social History Social History  Substance Use Topics  . Smoking status: Never Smoker  . Smokeless tobacco: Never Used  . Alcohol use No     Allergies   Contrast media [iodinated diagnostic agents] and Codeine   Review of Systems Review of Systems  All other systems reviewed and are negative.    Physical Exam Updated Vital Signs BP 122/67 (BP Location: Right Arm)   Pulse 102   Temp 98.4 F (36.9 C) (Oral)   Resp 16   SpO2 99%   Physical Exam  Constitutional: She is oriented to person, place, and time. She appears well-developed and well-nourished.  HENT:  Head: Normocephalic and atraumatic.  Eyes: Conjunctivae are normal. Pupils are equal, round, and reactive to light. Right eye exhibits no discharge. Left eye exhibits no discharge. No scleral icterus.  Neck: Normal range of motion. No JVD present. No tracheal deviation present.  Pulmonary/Chest: Effort normal. No stridor.  Musculoskeletal:  Minor swelling to proximal foot and medial ankle. TTP, slight warmth, no redness. Pt unable to attempt ROM due to discomfort  Right ankle soft, supple, with no warmth or tenderness   Neurological: She is alert and oriented to person, place, and time. Coordination normal.  Psychiatric: She has a normal mood and affect. Her behavior is normal. Judgment and thought content normal.  Nursing note and vitals reviewed.    ED Treatments / Results  Labs (all labs ordered are listed, but only abnormal results are displayed) Labs Reviewed - No data to display  EKG  EKG Interpretation None       Radiology Dg Ankle Complete Left  Result Date: 10/12/2016 CLINICAL DATA:  Left ankle pain for 2 days with dorsal flexion. No known injury. EXAM: LEFT ANKLE COMPLETE - 3+ VIEW COMPARISON:  None. FINDINGS:  No acute bony or joint abnormality is seen. Mild tibiotalar and midfoot degenerative disease is identified. Calcaneal spurring is noted. Small radiopaque densities projecting in the distal tibia may be due to old trauma. IMPRESSION: No acute abnormality. Mild tibiotalar and midfoot osteoarthritis. Calcaneal spurring. Electronically Signed   By: Inge Rise M.D.   On: 10/12/2016 10:46   Dg Foot Complete Left  Result Date: 10/12/2016 CLINICAL DATA:  Left foot pain for 2 days. No known injury. Initial encounter. EXAM: LEFT FOOT - COMPLETE 3+ VIEW COMPARISON:  None. FINDINGS: No acute bony or joint abnormality is seen. Mild hallux valgus deformity is identified. Mild to moderate first MTP osteoarthritis is noted. Mild midfoot and tibiotalar degenerative disease is also seen. Calcaneal spurring  noted. IMPRESSION: No acute finding. 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 Electronically Signed   By: Inge Rise M.D.   On: 10/12/2016 10:47    Procedures Procedures (including critical care time)  Medications Ordered in ED Medications  HYDROcodone-acetaminophen (NORCO/VICODIN) 5-325 MG per tablet 1 tablet (1 tablet Oral Given 10/12/16 1032)     Initial Impression / Assessment and Plan / ED Course  I have reviewed the triage vital signs and the nursing notes.  Pertinent labs & imaging results that were available during my care of the patient were reviewed by me and considered in my medical decision making (see chart for details).      Final Clinical Impressions(s) / ED Diagnoses   Final diagnoses:  Left foot pain  Acute left ankle pain    Labs: DG foot, DG ankle   Imaging:  Consults:  Therapeutics: hydrocodone   Discharge Meds:   Assessment/Plan: 58 YOF presents today with left foot/ankle pain. 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 Pt has no joint effusion, no redness, and only minor warmth to touch. She  is afebrile with no previous infectious etiology. She does not have hardware in place. No history of gout. With similar pain in the past that resolved on it own with no intervention. She is tachycardic here, chart review shows all previous visits patient was tachycardic. Case was discussed with attending physician who agreed that infectious etiology low likely and patient could be treated symptomatically with close follow-up. Pt was instructed to follow up with her PCP in two days for re check. She was instructed to return to ED immediately if any new or worsening signs or symptoms present.  Pt to use ibuprofen, Percocet that was previously prescribed, elevation, and ice. She verbalized understanding and agreement to today's plan.        New Prescriptions Discharge Medication List as of 10/12/2016 11:12 AM       Okey Regal, PA-C 10/12/16 2020    Virgel Manifold, MD 10/28/16 1427

## 2016-10-12 NOTE — Discharge Instructions (Signed)
Please read attached information. If you experience any new or worsening signs or symptoms please return to the emergency room for evaluation. Please follow-up with your primary care provider or specialist as discussed. Please use previously prescribed medication only as directed and discontinue taking if you have any concerning signs or symptoms.   °

## 2016-10-12 NOTE — ED Triage Notes (Signed)
Pt reports to the ED for eval of left foot pain. She has hx of surgery in the 80s for acute trauma to the left ankle. She states weight bearing increases the pain. Onset of pain was last night. Denies any known injury to her ankle. Some increased swelling and warmth noted to left ankle. No erythema or obvious deformity. CMS intact. Pt has taken Aleve and ASA at home but neither helped the pain.

## 2016-10-14 DIAGNOSIS — I10 Essential (primary) hypertension: Secondary | ICD-10-CM | POA: Diagnosis not present

## 2016-10-14 DIAGNOSIS — E1165 Type 2 diabetes mellitus with hyperglycemia: Secondary | ICD-10-CM | POA: Diagnosis not present

## 2016-10-14 DIAGNOSIS — M14872 Arthropathies in other specified diseases classified elsewhere, left ankle and foot: Secondary | ICD-10-CM | POA: Diagnosis not present

## 2016-10-29 ENCOUNTER — Other Ambulatory Visit: Payer: Self-pay | Admitting: Orthopedic Surgery

## 2016-10-29 DIAGNOSIS — M5126 Other intervertebral disc displacement, lumbar region: Secondary | ICD-10-CM

## 2016-10-30 ENCOUNTER — Encounter: Payer: Self-pay | Admitting: Internal Medicine

## 2016-10-30 ENCOUNTER — Ambulatory Visit (INDEPENDENT_AMBULATORY_CARE_PROVIDER_SITE_OTHER): Payer: Medicare Other | Admitting: Internal Medicine

## 2016-10-30 ENCOUNTER — Telehealth: Payer: Self-pay | Admitting: Internal Medicine

## 2016-10-30 VITALS — BP 114/62 | HR 99 | Ht 63.0 in | Wt 226.0 lb

## 2016-10-30 DIAGNOSIS — R05 Cough: Secondary | ICD-10-CM | POA: Diagnosis not present

## 2016-10-30 DIAGNOSIS — D869 Sarcoidosis, unspecified: Secondary | ICD-10-CM

## 2016-10-30 DIAGNOSIS — R058 Other specified cough: Secondary | ICD-10-CM

## 2016-10-30 MED ORDER — GABAPENTIN 100 MG PO CAPS: 100.0000 mg | ORAL_CAPSULE | Freq: Two times a day (BID) | ORAL | 2 refills | Status: DC

## 2016-10-30 NOTE — Progress Notes (Signed)
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 Sarcoid related with cxr that did not support active dz  History of Present Illness  08/08/2014 1st [facility] office visit/ Cathy Stevenson  / on ACEi for ? Decades  Chief Complaint  Patient presents with  . Pulmonary Consult    Referred by Dr. Haskel Schroeder. Pt states that she was dxed with Sarcoid 32 yrs ago  by lung bx. She c/o cough and chest discomfort that has bothered her since she was dxed. Her cough is prod with minimal yellow sputum.  She also c/o DOE for the past 2 wks- with walking short distances and lifting things.   cough is chronic x years, more day than night, and when severe assoc with doe x more than room to room with audible wheezing/ sensation of something stuck in her throat but actually very little mucus production, saba not very helpful rec Stop ramapril and your symptoms should gradually improve over several weeks Start diovan 160 mg one daily  Ok to use the nebulizer if needed up to 4 hours > breathing/ chest tightness/ asthma/ wheezing (it will last 4 hours)  Change nexium where you take it Take 30- 60 min before your first and last meals of the day GERD diet    09/30/2014 f/u ov/Cathy Stevenson re: ? Sarcoid  Chief Complaint  Patient presents with  . Follow-up    PFT done today. Pt states that her cough and SOB are unchanged since the last visit. She has not needed albuterol neb or inhaler. No new co's today.   on nexium 20 mg bid but not taking ac as rec  slt swelling when upright since change to diovan controls with prn lasix >>neurontin  300 Three times a day    12/29/2014 NP Acute OV /ER follow up  Seen in ER 12/28/14  for persistent cough and chest tightness.  Complains of  chest tightness, prod cough with thick clear mucus, increased SOB x 1 week, worse x1 day resulting in ER visit.   pt believes this is due to her Sarcoid Dx w/ pulled muscle, given steroids in ER and rx mobic.  No better.  Still coughing a lot.  Pain on inspiration.  Cardiac enzymes neg. nml BNP.  rec Decrease prednisone 20mg  daily for 1 week then 10mg  daily for 1 week then stop.  Labs today.  Begin Delsym 2 tsp Twice daily  For cough .  Zyrtec 10mg  At bedtime  For drainage .  Decrease Neurontin back to previous dose 100mg  Three times a day      01/19/2015 f/u ov/Cathy Stevenson re: sarcoid/ chronic cough x years/ not clear really which meds she's taking  Chief Complaint  Patient presents with  . Follow-up    Pt states she is feeling no better since the last visit- still c/o chest tightness, cough with white sputum and SOB.  She is using neb 3 x per day on average.  only better while on mobic and prednisone x 5 days /otherwise the same x years   Last neb was 12 h  prior to OV   L ant cp x one month, worse with cough or bending down, "different from my sarcoid pain which was on the R" rec Pantoprazole (protonix) 40 mg (nexium 40 mg)   Take  30-60 min before first meal of the day and Pepcid (famotidine)  20 mg one @  bedtime until return to office - this is the best way to tell whether stomach acid is contributing to your problem.   Avoid   foods that you know cause gas (especially boiled eggs/  beans and raw vegetables like spinach and salads)  and citrucel 1 heaping tsp twice daily with a large glass of water.  Pain should improve w/in 2 weeks and if not then consider further GI work up.    For chest pain ok to try mobic 7.5 mg up to twice daily with meals as needed  Restart neurtonin 100 mg four times daily automatically  Return in 4weeks with all meds > did  not do    10/30/2016  Acute extenended f/u ov/Cathy Stevenson re re-establish  cough c/w uacs/ irritable larynx not sarcoid related  Chief Complaint  Patient presents with  . Acute Visit    Pt c/o increased SOB and "burning in chest" since Nov 2017.    sleeps on 5 pillows and cpap from [place] > 5  prior to OV   Walking from bed to BR starts hocking up mucus but white and minimal  Chest burning comes and goes sometimes at hs  Taking ppi at hs/ no pepcid at all   No obvious day to day or daytime variability or assoc excess/ purulent sputum or mucus plugs or hemoptysis or cp or chest tightness, subjective wheeze or overt sinus or hb symptoms. No unusual exp hx or h/o childhood pna/ asthma or knowledge of premature birth.  Sleeping ok without nocturnal  or early am exacerbation  of respiratory  c/o's or need for noct saba. Also denies any obvious fluctuation of symptoms with weather or environmental changes or other aggravating or alleviating factors except as outlined above   Current Medications, Allergies, Complete Past Medical History, Past Surgical History, Family History, and Social History were reviewed in [employer] record.  ROS  The following are not active complaints unless bolded sore throat, dysphagia, dental problems, itching, sneezing,  nasal congestion or excess/ purulent secretions, ear ache,   fever, chills, sweats, unintended wt loss, classically pleuritic or exertional cp,  orthopnea pnd or leg swelling, presyncope, palpitations, abdominal pain, anorexia, nausea, vomiting, diarrhea  or change in bowel or bladder habits, change in stools or urine, dysuria,hematuria,  rash, arthralgias, visual complaints, headache, numbness, weakness or ataxia or problems with walking or coordination,  change in mood/affect or memory.           Objective:   Physical Exam  amb obese bf nad with grinding throat clearing freq duing interview/exam    10/30/2016       226   01/19/15  226 lb (102.513 kg)  12/29/14 227 lb (102.967 kg)  09/30/14 222 lb (100.699 kg)    Vital signs reviewed - Note on arrival 02 sats  99% on RA     HEENT: nl dentition, turbinates, and orophanx. Nl external ear canals without cough reflex   NECK :  without JVD/Nodes/TM/ nl carotid upstrokes bilaterally   LUNGS: no acc muscle use, clear to A and P bilaterally without cough on insp or exp maneuvers   CV:  RRR  no s3  or murmur or increase in P2, no edema   ABD:  soft and nontender with nl excursion in the supine position. No bruits or organomegaly, bowel sounds nl  MS:  warm without deformities, calf tenderness, cyanosis or clubbing  SKIN: warm and dry without lesions    NEURO:  alert, approp, no deficits      I personally reviewed images and agree with radiology impression as follows:  CXR:   10/02/16 No active cardiopulmonary disease.        Assessment & Plan:

## 2016-10-30 NOTE — Assessment & Plan Note (Addendum)
-   trial off acei 08/09/2014 >  Not convinced better 09/30/14 > rec neurontin 300 tid and max gerd rx > could not tol 300 so d/c ? Helped? - rechallenged with neurontin 100 qid 01/19/15 > did not tol - Rechallenged with gabapentin 100 bid and 1st gen h1 10/30/2016 >>>   She did not return as rec for f/u and did not disclose originally that her throat clearing dates back to childhood and is shared by her sister - this well have led to over treatment for sarcoid previously but this is most likely Upper airway cough syndrome (previously labeled PNDS) , is  so named because it's frequently impossible to sort out how much is  CR/sinusitis with freq throat clearing (which can be related to primary GERD)   vs  causing  secondary (" extra esophageal")  GERD from wide swings in gastric pressure that occur with throat clearing, often  promoting self use of mint and menthol lozenges that reduce the lower esophageal sphincter tone and exacerbate the problem further in a cyclical fashion.   These are the same pts (now being labeled as having "irritable larynx syndrome" by some cough centers) who not infrequently have a history of having failed to tolerate ace inhibitors,  dry powder inhalers or biphosphonates or report having atypical/extraesophageal reflux symptoms that don't respond to standard doses of PPI  and are easily confused as having aecopd or asthma flares by even experienced allergists/ pulmonologists (myself included).    rec rx as above and then return  with all meds in hand using a trust but verify approach to confirm accurate Medication  Reconciliation The principal here is that until we are certain that the  patients are doing what we've asked, it makes no sense to ask them to do more.    I had an extended discussion with the patient reviewing all relevant studies completed to date and  lasting 25 minutes of a 40  minute acute office visit addressing longterm  non-specific but potentially very serious  refractory respiratory symptoms of unknown etiology.  Each maintenance medication was reviewed in detail including most importantly the difference between maintenance and prns and under what circumstances the prns are to be triggered using an action plan format that is not reflected in the computer generated alphabetically organized AVS.    Please see AVS for specific instructions unique to this office visit that I personally wrote and verbalized to the the pt in detail and then reviewed with pt  by my nurse highlighting any changes in therapy/plan of care  recommended at today's visit.

## 2016-10-30 NOTE — Assessment & Plan Note (Signed)
Body mass index is 40.03   trending unchanged  No results found for: TSH   Contributing to gerd risk/ doe/reviewed the need and the process to achieve and maintain neg calorie balance > defer f/u primary care including intermittently monitoring thyroid status

## 2016-10-30 NOTE — Telephone Encounter (Signed)
Called [facility]. They are needing clarification on pt's Gabapentin prescription. Per pt's AVS instructions, she should be taking Gabapentin 100mg  BID. Clarification has been given to [facility]. Nothing further was needed.

## 2016-10-30 NOTE — Patient Instructions (Signed)
Esmeprapzole 40 mg   Take  30-60 min before first meal of the day and Pepcid (famotidine)  20 mg one @  bedtime until return to office - this is the best way to tell whether stomach acid is contributing to your problem.    Gabapentin 100 mg twice breakfast and supper   GERD (REFLUX)  is an extremely common cause of respiratory symptoms just like yours , many times with no obvious heartburn at all.    It can be treated with medication, but also with lifestyle changes including elevation of the head of your bed (ideally with 6 inch  bed blocks),  Smoking cessation, avoidance of late meals, excessive alcohol, and avoid fatty foods, chocolate, peppermint, colas, red wine, and acidic juices such as orange juice.  NO MINT OR MENTHOL PRODUCTS SO NO COUGH DROPS   USE SUGARLESS CANDY INSTEAD (Jolley ranchers or Stover's or Life Savers) or even ice chips will also do - the key is to swallow to prevent all throat clearing. NO OIL BASED VITAMINS - use powdered substitutes.  For drainage / throat tickle try take CHLORPHENIRAMINE  4 mg - take one every 4 hours as needed - available over the counter- may cause drowsiness so start with just a bedtime dose or two and see how you tolerate it before trying in daytime     See Tammy NP w/in 2 weeks with all your medications, even over the counter meds, separated in two separate bags, the ones you take no matter what vs the ones you stop once you feel better and take only as needed when you feel you need them.   Tammy  will generate for you a new user friendly medication calendar that will put [place] all on the same page re: your medication use.     Without this process, it simply isn't possible to assure that we are providing  your outpatient care  with  the attention to detail we feel you deserve.   If we cannot assure that you're getting that kind of care,  then we cannot manage your problem effectively from this clinic.  Once you have seen Tammy and we are sure that  we're all on the same page with your medication use she will arrange follow up with me or With Dr Joya Gaskins at [facility]

## 2016-10-30 NOTE — Assessment & Plan Note (Signed)
-   PFTs 09/30/14  VC 79% pred/ dlco 65 but corrects with alv vol to 101%   A good rule of thumb is that >95% of pts with active sarcoid in any organ will have some plain cxr changes - on the other hand  if there are active pulmonary symptoms the cxr will look much worse than the patient:  No evidence of either scenario here/ strongly doubt active dz

## 2016-11-11 ENCOUNTER — Ambulatory Visit
Admission: RE | Admit: 2016-11-11 | Discharge: 2016-11-11 | Disposition: A | Discharge status: Home / Self Care | Payer: Medicare Other | Source: Ambulatory Visit | Attending: Orthopedic Surgery | Admitting: Orthopedic Surgery

## 2016-11-11 DIAGNOSIS — M5126 Other intervertebral disc displacement, lumbar region: Secondary | ICD-10-CM

## 2016-11-13 ENCOUNTER — Encounter: Payer: Self-pay | Admitting: Adult Health

## 2016-11-13 ENCOUNTER — Ambulatory Visit (INDEPENDENT_AMBULATORY_CARE_PROVIDER_SITE_OTHER): Payer: Medicare Other | Admitting: Adult Health

## 2016-11-13 DIAGNOSIS — D869 Sarcoidosis, unspecified: Secondary | ICD-10-CM

## 2016-11-13 DIAGNOSIS — R05 Cough: Secondary | ICD-10-CM

## 2016-11-13 DIAGNOSIS — R058 Other specified cough: Secondary | ICD-10-CM

## 2016-11-13 NOTE — Assessment & Plan Note (Signed)
Appears stable w/ out flare  

## 2016-11-13 NOTE — Progress Notes (Signed)
@Patient  ID: Cathy Stevenson, female    DOB: 1956/07/04, 61 y.o.   MRN: [id number]  Chief Complaint  Patient presents with  . 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 Sarcoid related with cxr that did not support active dz   TEST  Sarcoid Dx w/ biopsy in [facility] ~1983 ACE Level 54 2016  PFTs 09/30/14  VC 79% pred/ dlco 65 but corrects with alv vol to 101%  trial off acei 08/09/2014 >  Not convinced better 09/30/14 > rec neurontin 300 tid and max gerd rx > could not tol 300 so d/c ? Helped? - rechallenged with neurontin 100 qid 01/19/15 > did not tol - Rechallenged with gabapentin 100 bid and 1st gen h1 10/30/2016 >>>  CT chest 06/2016 Neg for PE , Scarring in the upper lungs and lung apices.   11/13/2016 Follow up : UACS/Irritable larynx syndrome/Sarcoid /Med Review  Pt returns for 2 week follow up . She has hx of chronic cough/throat clearing for years. She has been tried on multiple regimens to help prevent this Gabapentin, GERD and AR treatment . She says nothing has helped this . Last ov she treated with Chlortrimeton but did not see this on instruction sheet . She remains on Neurontin Twice daily w/ no sign improvement .   She does have hx of Sarcoid with previous steroid and MTX treatment . Felt not to be active at this time. CT chest in 06/2016 with scarring noted in upper lungs/apices and mosiac pattern .  She denies incraseed cough, rash or hemoptysis . No sob.    We reviewed her meds and organized them into a med calendar with pt education .  She appears to be taking meds correctly except for chlortrimeton.    Allergies  Allergen  Reactions  . Contrast Media [Iodinated Diagnostic Agents] Itching and Swelling  . Codeine Itching    Immunization History  Administered Date(s) Administered  . Influenza Split 08/19/2013  . Influenza Whole 05/19/2016    Past Medical History:  Diagnosis Date  . Asthma   . Chronic headaches   . DM (diabetes mellitus) ([place])   . Hyperlipidemia   . OSA (obstructive sleep apnea)    on CPAP   . Sarcoidosis ([place])     Tobacco History: History  Smoking Status  . Never Smoker  Smokeless Tobacco  . Never Used   Counseling given: Not Answered   Outpatient Encounter Prescriptions as of 11/13/2016  Medication Sig  . albuterol (PROVENTIL) (2.5 MG/3ML) 0.083% nebulizer solution Take 3 mLs (2.5 mg total) by nebulization every 6 (six) hours as needed for wheezing or shortness of breath. Dx: D86.9  . amitriptyline (ELAVIL) 50 MG tablet Take 50 mg by mouth at bedtime.  Marland Kitchen aspirin EC 81 MG tablet Take 81 mg by mouth daily.  . clonazePAM (KLONOPIN) 0.5 MG tablet Take 0.5 mg by mouth at bedtime.   . furosemide (LASIX) 40 MG tablet Take 1 tablet (40 mg total) by mouth daily as needed. (Patient taking differently: Take 40 mg by mouth every evening. )  . gabapentin (NEURONTIN) 100 MG capsule Take 1 capsule (100 mg total)  by mouth 2 (two) times daily. One three times daily  . insulin aspart (NOVOLOG) 100 UNIT/ML injection Inject 30 Units into the skin 3 (three) times daily before meals.   . insulin detemir (LEVEMIR) 100 UNIT/ML injection Inject 50 Units into the skin 2 (two) times daily.  . naproxen (NAPROSYN) 500 MG tablet Take 1 tablet (500 mg total) by mouth 2 (two) times daily with a meal.  . valsartan (DIOVAN) 160 MG tablet TAKE 1 TABLET BY MOUTH DAILY  . verapamil (CALAN-SR) 180 MG CR tablet Take 180 mg by mouth daily.   No facility-administered encounter medications on file as of 11/13/2016.      Review of Systems  Constitutional:   No  weight loss, night sweats,  Fevers, chills, +  fatigue, or  lassitude.  HEENT:   No headaches,  Difficulty swallowing,  Tooth/dental problems, or  Sore throat,                No sneezing, itching, ear ache,  +nasal congestion, post nasal drip,   CV:  No chest pain,  Orthopnea, PND, swelling in lower extremities, anasarca, dizziness, palpitations, syncope.   GI  No heartburn, indigestion, abdominal pain, nausea, vomiting, diarrhea, change in bowel habits, loss of appetite, bloody stools.   Resp:  .  No chest wall deformity  Skin: no rash or lesions.  GU: no dysuria, change in color of urine, no urgency or frequency.  No flank pain, no hematuria   MS:  No joint pain or swelling.  No decreased range of motion.  No back pain.    Physical Exam  BP 140/60 (BP Location: Left Arm, Cuff Size: Normal)   Pulse 97   Ht 5' 2.5" (1.588 m)   Wt 225 lb 3.2 oz (102.2 kg)   SpO2 98%   BMI 40.53 kg/m   GEN: A/Ox3; pleasant , NAD, elderly    HEENT:  [place]/AT,  EACs-clear, TMs-wnl, NOSE-clear, THROAT-clear, no lesions, no postnasal drip or exudate noted.   NECK:  Supple w/ fair ROM; no JVD; normal carotid impulses w/o bruits; no thyromegaly or nodules palpated; no lymphadenopathy.    RESP  Clear  P & A; w/o, wheezes/ rales/ or rhonchi. no accessory muscle use, no dullness to percussion  CARD:  RRR, no m/r/g, no peripheral edema, pulses intact, no cyanosis or clubbing.  GI:   Soft & nt; nml bowel sounds; no organomegaly or masses detected.   Musco: Warm bil, no deformities or joint swelling noted.   Neuro: alert, no focal deficits noted.    Skin: Warm, no lesions or rashes   Lab Results:  CBC    Component Value Date/Time   WBC 7.9 10/02/2016 2042   RBC 4.02 10/02/2016 2042   HGB 10.4 (L) 10/02/2016 2042   HCT 33.0 (L) 10/02/2016 2042   PLT 218 10/02/2016 2042   MCV 82.1 10/02/2016 2042   MCH 25.9 (L) 10/02/2016 2042   MCHC 31.5 10/02/2016 2042   RDW 12.9 10/02/2016 2042    BMET    Component Value Date/Time   NA 135  10/02/2016 2042   K 3.9 10/02/2016 2042   CL 102 10/02/2016 2042   CO2 25 10/02/2016 2042   GLUCOSE 259 (H) 10/02/2016 2042   BUN 16 10/02/2016 2042   CREATININE 0.94 10/02/2016 2042   CALCIUM 9.6 10/02/2016 2042   GFRNONAA >60 10/02/2016 2042   GFRAA >60 10/02/2016 2042    BNP    Component Value Date/Time   BNP 26.7  07/12/2016 2348    ProBNP No results found for: PROBNP  Imaging: Mr Lumbar Spine Wo Contrast  Result Date: 11/11/2016 CLINICAL DATA:  Severe mid to low back pain since 07/12/2016. EXAM: MRI LUMBAR SPINE WITHOUT CONTRAST TECHNIQUE: Multiplanar, multisequence MR imaging of the lumbar spine was performed. No intravenous contrast was administered. COMPARISON:  CT scan dated 10/03/2016 and radiographs dated 10/02/2016 FINDINGS: Segmentation:  Standard. Alignment: Grade 1 spondylolisthesis ease of L3 on L4 and of L4 on L5. Vertebrae: Severe facet arthritis at L3-4 and L4-5, and auto fusion at L5-S1. Conus medullaris: Extends to the L1-2 level and appears normal. Paraspinal and other soft tissues: Negative. Disc levels: L1-2:  Slight disc desiccation.  Otherwise normal. L2-3: Slight disc desiccation. The disc is otherwise normal. Slight hypertrophy of the ligamentum flavum and facet joints without neural impingement. L3-4: 5 mm spondylolisthesis due to severe bilateral facet arthritis. Moderately severe spinal stenosis due to hypertrophy of the ligamentum flavum the spondylolisthesis. No bulging or protrusion of the uncovered disc. Moderate bilateral foraminal stenosis. L4-5: 4 mm spondylolisthesis due to severe bilateral facet arthritis with hypertrophy of the ligamentum flavum and facet joints with slight spinal stenosis and compression of the left lateral recess which might affect the left L5 nerve. Slight moderate right and mild left foraminal stenosis. No bulging or protrusion of the uncovered disc. L5-S1: Tiny disc bulge into the right lateral recess and proximal right neural  foramen without neural impingement. Auto fusion of L5-S1 on the right. IMPRESSION: 1. 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 2. Moderately severe spinal stenosis at L3-4. 3. No focal disc protrusions or significant disc bulges. Electronically Signed   By: Lorriane Shire M.D.   On: 11/11/2016 10:34     Assessment & Plan:   Upper airway cough syndrome Chronic cough unresponsive to therapy with Gabapentin, GERD tx -refer to [facility] Dr. Joya Gaskins  Add Chlortrimeton, and Delsym . Patient's medications were reviewed today and patient education was given. Computerized medication calendar was adjusted/completed   Plan . Patient Instructions  Refer to Dr. Joya Gaskins at [place] 4mg  1 every 4hr as needed for drainage/trhoat clearing  Begin Delsym 2 tsp every 12hr as needed.  Follow med calendar closely and bring to each visit.  Follow up Dr. Melvyn Novas  In 2 months and As needed       Sarcoidosis [facility]) Appears stable w/out flare       Rexene Edison, NP 11/13/2016

## 2016-11-13 NOTE — Progress Notes (Signed)
Chart and office note reviewed in detail  > agree with a/p as outlined    

## 2016-11-13 NOTE — Patient Instructions (Addendum)
Refer to Dr. Joya Gaskins at [facility] 4mg  1 every 4hr as needed for drainage/trhoat clearing  Begin Delsym 2 tsp every 12hr as needed.  Follow med calendar closely and bring to each visit.  Follow up Dr. Melvyn Novas  In 2 months and As needed

## 2016-11-13 NOTE — Assessment & Plan Note (Addendum)
Chronic cough unresponsive to therapy with Gabapentin, GERD tx -refer to [facility] Dr. Joya Stevenson  Add Chlortrimeton, and Delsym . Patient's medications were reviewed today and patient education was given. Computerized medication calendar was adjusted/completed   Plan . Patient Instructions  Refer to Dr. Joya Stevenson at [facility] 4mg  1 every 4hr as needed for drainage/trhoat clearing  Begin Delsym 2 tsp every 12hr as needed.  Follow med calendar closely and bring to each visit.  Follow up Dr. Melvyn Stevenson  In 2 months and As needed

## 2016-11-19 NOTE — Addendum Note (Signed)
Addended by: Parke Poisson E on: 11/19/2016 05:32 PM   Modules accepted: Orders

## 2016-11-22 NOTE — Addendum Note (Signed)
Addended by: Lorane Gell on: 11/22/2016 04:21 PM   Modules accepted: Orders

## 2016-11-29 ENCOUNTER — Encounter: Payer: Self-pay | Admitting: Nurse Practitioner

## 2016-12-09 ENCOUNTER — Encounter: Payer: Self-pay | Admitting: Nurse Practitioner

## 2016-12-09 ENCOUNTER — Other Ambulatory Visit (INDEPENDENT_AMBULATORY_CARE_PROVIDER_SITE_OTHER): Payer: Medicare Other

## 2016-12-09 ENCOUNTER — Ambulatory Visit (INDEPENDENT_AMBULATORY_CARE_PROVIDER_SITE_OTHER): Payer: Medicare Other | Admitting: Nurse Practitioner

## 2016-12-09 VITALS — BP 124/62 | HR 74 | Ht 62.5 in | Wt 228.0 lb

## 2016-12-09 DIAGNOSIS — K7460 Unspecified cirrhosis of liver: Secondary | ICD-10-CM | POA: Diagnosis not present

## 2016-12-09 DIAGNOSIS — K5909 Other constipation: Secondary | ICD-10-CM

## 2016-12-09 DIAGNOSIS — R1012 Left upper quadrant pain: Secondary | ICD-10-CM | POA: Diagnosis not present

## 2016-12-09 DIAGNOSIS — Z86010 Personal history of colonic polyps: Secondary | ICD-10-CM

## 2016-12-09 LAB — FOLATE: Folate: 14.8 ng/mL (ref >5.9)

## 2016-12-09 LAB — IRON AND TIBC
%SAT: 12 % (ref 11–50)
IRON: 46 ug/dL (ref 45–160)
TIBC: 384 ug/dL (ref 250–450)
UIBC: 338 ug/dL (ref 125–400)

## 2016-12-09 LAB — FERRITIN: Ferritin: 69 ng/mL (ref 10.0–291.0)

## 2016-12-09 LAB — VITAMIN B12: Vitamin B-12: 856 pg/mL (ref 211–911)

## 2016-12-09 NOTE — Patient Instructions (Signed)
If you are age 63 or older, your body mass index should be between 23-30. Your Body mass index is 41.04 kg/m. If this is out of the aforementioned range listed, please consider follow up with your Primary Care Provider.  If you are age 12 or younger, your body mass index should be between 19-25. Your Body mass index is 41.04 kg/m. If this is out of the aformentioned range listed, please consider follow up with your Primary Care Provider.   You have been scheduled for an endoscopy. Please follow written instructions given to you at your visit today. If you use inhalers (even only as needed), please bring them with you on the day of your procedure. Your physician has requested that you go to www.startemmi.com and enter the access code given to you at your visit today. This web site gives a general overview about your procedure. However, you should still follow specific instructions given to you by our office regarding your preparation for the procedure.  Your physician has requested that you go to the basement for lab work before leaving today.  Start Miralax 1 capful daily.  Thank you for choosing me and [facility].  Tye Savoy, NP

## 2016-12-09 NOTE — Progress Notes (Signed)
HPI: Patient is a 61 year old female, new to this practice, referred by PCP Dr. Nolene Ebbs for reflux. Patient has sarcoidosis  She is followed by Dr. Melvyn Novas with [facility]. Patient takes Nexium but does not think she has a history of GERD. She has no heartburn or regurgitation but a chronic cough and chronic throat clearing which hasn't responded to PPI.  Dr. Melvyn Novas has referred her to [facility]. Her main complaint is left upper quadrant pain present since November. Pain unrelated to meals, it might be better for a day or 2 after a bowel movement. No associated nausea. She has gained 12 pounds in the last few months. Patient has chronic constipation, she uses a suppository if goes 2 days without a bowel movement. No blood in stool. Last colonoscopy was in another city in 2013. Patient tells me polyps were removed, she was advised to have a repeat colonoscopy in 5 years.    Past Medical History:  Diagnosis Date  . Anxiety   . Asthma   . Chronic headaches   . Colon polyps   . DM (diabetes mellitus) ([place])   . GERD (gastroesophageal reflux disease)   . Hyperlipidemia   . OSA (obstructive sleep apnea)    on CPAP   . Sarcoidosis      Past Surgical History:  Procedure Laterality Date  . ABDOMINAL HYSTERECTOMY    . CHOLECYSTECTOMY  1997  . LUNG SURGERY  1998  . TUBAL LIGATION  1978  . VESICOVAGINAL FISTULA CLOSURE W/ TAH  1992   Family History  Problem Relation Age of Onset  . Allergies Daughter   . Asthma Son   . Esophageal cancer Brother   . Throat cancer Brother   . Multiple myeloma Brother   . Prostate cancer Brother   . Multiple myeloma Sister    Social History  Substance Use Topics  . Smoking status: Never Smoker  . Smokeless tobacco: Never Used  . Alcohol use No   Current Outpatient Prescriptions  Medication Sig Dispense Refill  . amitriptyline (ELAVIL) 50 MG tablet Take 50 mg by mouth at bedtime.    Marland Kitchen aspirin EC 81 MG tablet Take 81 mg by  mouth daily.    . budesonide-formoterol (SYMBICORT) 160-4.5 MCG/ACT inhaler Inhale 2 puffs into the lungs daily as needed.    . clonazePAM (KLONOPIN) 0.5 MG tablet Take 0.5 mg by mouth at bedtime.     Marland Kitchen esomeprazole (NEXIUM) 40 MG capsule Take 40 mg by mouth daily at 12 noon.    . furosemide (LASIX) 40 MG tablet Take 1 tablet (40 mg total) by mouth daily as needed. (Patient taking differently: Take 40 mg by mouth every evening. ) 30 tablet 11  . gabapentin (NEURONTIN) 100 MG capsule Take 1 capsule (100 mg total) by mouth 2 (two) times daily. One three times daily 60 capsule 2  . insulin aspart (NOVOLOG) 100 UNIT/ML injection Inject 30 Units into the skin 3 (three) times daily before meals.     . insulin detemir (LEVEMIR) 100 UNIT/ML injection Inject 60 Units into the skin 2 (two) times daily.     . valsartan (DIOVAN) 160 MG tablet TAKE 1 TABLET BY MOUTH DAILY 30 tablet 0  . verapamil (CALAN-SR) 180 MG CR tablet Take 180 mg by mouth daily.  2   No current facility-administered medications for this visit.    Allergies  Allergen Reactions  . Contrast Media [Iodinated Diagnostic Agents] Itching and Swelling  .  Codeine Itching     Review of Systems: Positive for anxiety, arthritis, back pain, cough, fatigue, heart rhythm changes, shortness of breath and sleeping problems. All other systems reviewed and negative except where noted in HPI.    Physical Exam: BP 124/62   Pulse 74   Ht 5' 2.5" (1.588 m)   Wt 228 lb (103.4 kg)   BMI 41.04 kg/m  Constitutional:  Obese black female in no acute distress. Psychiatric: Normal mood and affect. Behavior is normal. EENT:  Conjunctivae are normal. No scleral icterus. Neck supple.  Cardiovascular: Normal rate, regular rhythm.  Pulmonary/chest: Effort normal and breath sounds normal. No wheezing, rales or rhonchi. Abdominal: Soft, nondistended, nontender. Bowel sounds active throughout. There are no masses palpable. No hepatomegaly. Extremities: no  edema Lymphadenopathy: No cervical adenopathy noted. Neurological: Alert and oriented to person place and time. Skin: Skin is warm and dry. No rashes noted.   ASSESSMENT AND PLAN:  104. 61 year old female history of colon polyps. Colonoscopy done in 2013 in another city. I have asked the patient to help US obtain a copy of the colonoscopy so we can determine if and when she is due for follow-up. No bowel changes or blood in stool.  2. Chronic constipation, uses suppositories as needed.  -Recommend starting MiraLAX 1 capful daily  3. Vague, intermittent left mid abdominal pain since November. There seems to be no relationship with eating or activity. Transient relief often follows a bowel movement.  Non contrast CTscan mid February unrevealing.  -Treat constipation and see if pain resolves. Will see how she does with MiraLAX - EGD to be done for varices screening but it may turn up the source of pain .    4. Cirrhosis by non contrast CT scan of abd/pelvis as well as chest CTA . Clinically appears compensated, not even sure she has associated portal HTN. No obvious ascites on exam, none on imaging. No hepatic encephalopathy. Normal platelet count. Etiology of cirrhosis? No ETOH history.  She did take methotrexate but only for a couple of months. Rule out chronic viral hepatitis vrs NASH related vrs other. -check viral hepatitis studies -INR -EGD for varices screening. The risks and benefits of EGD were discussed and the patient agrees to proceed.   5. Normocytic anemia, hgb overall stable at 10.4. Baseline 11.5-12.0 since Oct 2017.  -iron studies  6. Diabetes / obesity  7. Sarcoidosis, followed by Dr. Melvyn Novas and deemed stable at recent visit.    Tye Savoy, NP  12/09/2016, 9:50 AM  Cc:  Nolene Ebbs, MD

## 2016-12-10 LAB — HEPATITIS B SURFACE ANTIGEN: HEP B S AG: NEGATIVE

## 2016-12-10 LAB — HEPATITIS B SURFACE ANTIBODY,QUALITATIVE: Hep B S Ab: POSITIVE — AB

## 2016-12-10 LAB — HEPATITIS C ANTIBODY: HCV Ab: NEGATIVE

## 2016-12-10 LAB — HEPATITIS A ANTIBODY, TOTAL: Hep A Total Ab: NONREACTIVE

## 2016-12-10 NOTE — Progress Notes (Signed)
Reviewed and agree with documentation and assessment and plan. K. Veena Nandigam , MD   

## 2016-12-12 ENCOUNTER — Telehealth: Payer: Self-pay | Admitting: Nurse Practitioner

## 2016-12-13 ENCOUNTER — Ambulatory Visit (AMBULATORY_SURGERY_CENTER): Payer: Medicare Other | Admitting: Gastroenterology

## 2016-12-13 ENCOUNTER — Encounter: Payer: Self-pay | Admitting: Gastroenterology

## 2016-12-13 VITALS — BP 137/77 | HR 92 | Temp 97.5°F | Resp 17 | Ht 62.5 in | Wt 228.0 lb

## 2016-12-13 DIAGNOSIS — K7581 Nonalcoholic steatohepatitis (NASH): Secondary | ICD-10-CM

## 2016-12-13 DIAGNOSIS — K7460 Unspecified cirrhosis of liver: Secondary | ICD-10-CM | POA: Diagnosis not present

## 2016-12-13 DIAGNOSIS — K2990 Gastroduodenitis, unspecified, without bleeding: Secondary | ICD-10-CM | POA: Diagnosis not present

## 2016-12-13 DIAGNOSIS — K2970 Gastritis, unspecified, without bleeding: Secondary | ICD-10-CM | POA: Diagnosis not present

## 2016-12-13 MED ORDER — SODIUM CHLORIDE 0.9 % IV SOLN: 500.0000 mL | INTRAVENOUS | Status: AC

## 2016-12-13 NOTE — Progress Notes (Signed)
A and O x3. Report to RN. Tolerated MAC anesthesia well.

## 2016-12-13 NOTE — Op Note (Signed)
[facility] Patient Name: Cathy Stevenson Procedure Date: 12/13/2016 11:26 AM MRN: [id number] Endoscopist: Mauri Pole , MD Age: 61 Referring MD:  Date of Birth: [birth year] Gender: Female Account #: [id number] Procedure:                Upper GI endoscopy Indications:              Suspected esophageal varices in patient with                            suspected portal hypertension Medicines:                Monitored Anesthesia Care Procedure:                Pre-Anesthesia Assessment:                           - Prior to the procedure, a History and Physical                            was performed, and patient medications and                            allergies were reviewed. The patient's tolerance of                            previous anesthesia was also reviewed. The risks                            and benefits of the procedure and the sedation                            options and risks were discussed with the patient.                            All questions were answered, and informed consent                            was obtained. Prior Anticoagulants: The patient has                            taken no previous anticoagulant or antiplatelet                            agents. ASA Grade Assessment: III - A patient with                            severe systemic disease. After reviewing the risks                            and benefits, the patient was deemed in                            satisfactory condition to undergo the procedure.  After obtaining informed consent, the endoscope was                            passed under direct vision. Throughout the                            procedure, the patient's blood pressure, pulse, and                            oxygen saturations were monitored continuously. The                            Model GIF-HQ190 [id number]) scope was introduced                            through the mouth,  and advanced to the second part                            of duodenum. The upper GI endoscopy was                            accomplished without difficulty. The patient                            tolerated the procedure well. Scope In: Scope Out: Findings:                 No gross lesions were noted in the entire                            esophagus. Regular Z-line. No evidence of                            esophageal varices                           Scattered mild inflammation characterized by                            congestion (edema), erythema, friability and                            granularity was found in the entire examined                            stomach suggestive of mild portal                            hypertensivegastropathy. Biopsies were taken with a                            cold forceps for histology. No evidence of gastric                            varices  The examined duodenum was normal. Complications:            No immediate complications. Estimated Blood Loss:     Estimated blood loss was minimal. Impression:               - No varices in esophagus.                           - Portal hypertensive gastropathy, mild. Biopsied.                           - Normal examined duodenum. Recommendation:           - Resume previous diet.                           - Continue present medications.                           - No aspirin, ibuprofen, naproxen, or other                            non-steroidal anti-inflammatory drugs.                           - Await pathology results.                           - Repeat upper endoscopy in 2 years for screening                            purposes.                           - Return to GI clinic in 6 months. Mauri Pole, MD 12/13/2016 11:45:54 AM This report has been signed electronically.

## 2016-12-13 NOTE — Progress Notes (Signed)
Called to room to assist during endoscopic procedure.  Patient ID and intended procedure confirmed with present staff. Received instructions for my participation in the procedure from the performing physician.  

## 2016-12-13 NOTE — Patient Instructions (Addendum)
YOU HAD AN ENDOSCOPIC PROCEDURE TODAY AT [facility]:   Refer to the procedure report that was given to you for any specific questions about what was found during the examination.  If the procedure report does not answer your questions, please call your gastroenterologist to clarify.  If you requested that your care partner not be given the details of your procedure findings, then the procedure report has been included in a sealed envelope for you to review at your convenience later.  YOU SHOULD EXPECT: Some feelings of bloating in the abdomen. Passage of more gas than usual.  Walking can help get rid of the air that was put into your GI tract during the procedure and reduce the bloating. If you had a lower endoscopy (such as a colonoscopy or flexible sigmoidoscopy) you may notice spotting of blood in your stool or on the toilet paper. If you underwent a bowel prep for your procedure, you may not have a normal bowel movement for a few days.  Please Note:  You might notice some irritation and congestion in your nose or some drainage.  This is from the oxygen used during your procedure.  There is no need for concern and it should clear up in a day or so.  SYMPTOMS TO REPORT IMMEDIATELY:   Following upper endoscopy (EGD)  Vomiting of blood or coffee ground material  New chest pain or pain under the shoulder blades  Painful or persistently difficult swallowing  New shortness of breath  Fever of 100F or higher  Black, tarry-looking stools  For urgent or emergent issues, a gastroenterologist can be reached at any hour by calling [phone].   DIET:  We do recommend a small meal at first, but then you may proceed to your regular diet.  Drink plenty of fluids but you should avoid alcoholic beverages for 24 hours.  MEDICATIONS:  Continue present medications. No Aspirin, Ibuprofen, Naproxen, or other non-steroidal anti-inflammatory drugs.  ACTIVITY:  You should plan to take it  easy for the rest of today and you should NOT DRIVE or use heavy machinery until tomorrow (because of the sedation medicines used during the test).    FOLLOW UP: Our staff will call the number listed on your records the next business day following your procedure to check on you and address any questions or concerns that you may have regarding the information given to you following your procedure. If we do not reach you, we will leave a message.  However, if you are feeling well and you are not experiencing any problems, there is no need to return our call.  We will assume that you have returned to your regular daily activities without incident.  If any biopsies were taken you will be contacted by phone or by letter within the next 1-3 weeks.  Please call us at [phone] if you have not heard about the biopsies in 3 weeks.   Repeat upper endoscopy in 2 years for screening purposes  Return to GI clinic in 6 months.  Patient given phone number to make appointment for the gastric bypass seminar and told to google "Bariatric Surgeries" at [facility] for more information.  SIGNATURES/CONFIDENTIALITY: You and/or your care partner have signed paperwork which will be entered into your electronic medical record.  These signatures attest to the fact that that the information above on your After Visit Summary has been reviewed and is understood.  Full responsibility of the confidentiality of this discharge information lies  with you and/or your care-partner.

## 2016-12-16 ENCOUNTER — Telehealth: Payer: Self-pay

## 2016-12-16 NOTE — Telephone Encounter (Signed)
  Follow up Call-  Call back number 12/13/2016  Post procedure Call Back phone  # [phone]  Permission to leave phone message Yes     Patient questions:  Do you have a fever, pain , or abdominal swelling? No. Pain Score  0 *  Have you tolerated food without any problems? Yes.    Have you been able to return to your normal activities? Yes.    Do you have any questions about your discharge instructions: Diet   No. Medications  No. Follow up visit  No.  Do you have questions or concerns about your Care? No.  Actions: * If pain score is 4 or above: No action needed, pain <4.

## 2016-12-16 NOTE — Telephone Encounter (Signed)
Records request already faxed to Provider.

## 2016-12-19 ENCOUNTER — Encounter: Payer: Self-pay | Admitting: Gastroenterology

## 2017-01-08 ENCOUNTER — Other Ambulatory Visit: Payer: Self-pay

## 2017-01-09 ENCOUNTER — Ambulatory Visit (INDEPENDENT_AMBULATORY_CARE_PROVIDER_SITE_OTHER): Payer: Medicare Other | Admitting: Gastroenterology

## 2017-01-09 DIAGNOSIS — K7460 Unspecified cirrhosis of liver: Secondary | ICD-10-CM | POA: Diagnosis not present

## 2017-01-09 DIAGNOSIS — Z23 Encounter for immunization: Secondary | ICD-10-CM

## 2017-01-10 ENCOUNTER — Telehealth: Payer: Self-pay | Admitting: Internal Medicine

## 2017-01-10 NOTE — Telephone Encounter (Signed)
Apt has been canceled for 01/14/17 and rescheduled for 02/13/17. Nothing further needed.

## 2017-01-10 NOTE — Telephone Encounter (Signed)
Spoke with pt, who states she has a scheduled apt with Dr. Joya Gaskins on 01/29/17. Pt is scheduled to f/u with MW on  01/14/17, and is wanting to know if she should reschedule apt until after seeing Dr. Joya Gaskins at [facility].  MW please advise. Thanks.   11/13/16 Refer to Dr. Joya Gaskins at [facility]  1 every 4hr as needed for drainage/trhoat clearing  Begin Delsym 2 tsp every 12hr as needed.  Follow med calendar closely and bring to each visit.  Follow up Dr. Melvyn Novas  In 2 months and As needed

## 2017-01-10 NOTE — Telephone Encounter (Signed)
Yes wait about 2 weeks p ov with f/u Dr Joya Gaskins

## 2017-01-14 ENCOUNTER — Ambulatory Visit: Payer: Medicare Other | Admitting: Internal Medicine

## 2017-02-13 ENCOUNTER — Ambulatory Visit (INDEPENDENT_AMBULATORY_CARE_PROVIDER_SITE_OTHER): Payer: Medicare Other | Admitting: Internal Medicine

## 2017-02-13 ENCOUNTER — Encounter: Payer: Self-pay | Admitting: Internal Medicine

## 2017-02-13 VITALS — BP 130/80 | HR 99 | Ht 62.0 in | Wt 227.6 lb

## 2017-02-13 DIAGNOSIS — D869 Sarcoidosis, unspecified: Secondary | ICD-10-CM | POA: Diagnosis not present

## 2017-02-13 DIAGNOSIS — R058 Other specified cough: Secondary | ICD-10-CM

## 2017-02-13 DIAGNOSIS — R05 Cough: Secondary | ICD-10-CM

## 2017-02-13 MED ORDER — GABAPENTIN 100 MG PO CAPS: ORAL_CAPSULE | ORAL | 2 refills | Status: DC

## 2017-02-13 NOTE — Progress Notes (Signed)
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 Sarcoid related with cxr that did not support active dz    History of Present Illness  08/08/2014 1st [facility] office visit/ Cathy Stevenson  / on ACEi for ? Decades  Chief Complaint  Patient presents with  . Pulmonary Consult    Referred by Dr. Haskel Schroeder. Pt states that she was dxed with Sarcoid 30 yrs ago  by lung bx. She c/o cough and chest discomfort that has bothered her since she was dxed. Her cough is prod with minimal yellow sputum.  She also c/o DOE for the past 2 wks- with walking short distances and lifting things.   cough is chronic x years, more day than night, and when severe assoc with doe x more than room to room with audible wheezing/ sensation of something stuck in her throat but actually very little mucus production, saba not very helpful rec Stop ramapril and your symptoms should gradually improve over several weeks Start diovan 160 mg one daily  Ok to use the nebulizer if needed up to 4 hours > breathing/ chest tightness/ asthma/ wheezing (it will last 4 hours)  Change nexium where you take it Take 30- 60 min before your first and last meals of the day GERD diet    09/30/2014 f/u ov/Cathy Stevenson re: ? Sarcoid  Chief Complaint  Patient presents with  . Follow-up    PFT done today. Pt states that her cough and SOB are unchanged since the last visit. She has not needed albuterol neb or inhaler. No new co's today.   on nexium 20 mg bid but not taking ac as rec  slt swelling when upright since change to diovan controls with prn  lasix >>neurontin 300 Three times a day    12/29/2014 NP Acute OV /ER follow up  Seen in ER 12/28/14  for persistent cough and chest tightness.  Complains of  chest tightness, prod cough with thick clear mucus, increased SOB x 1 week, worse x1 day resulting in ER visit.   pt believes this is due to her Sarcoid Dx w/ pulled muscle, given steroids in ER and rx mobic.  No better.  Still coughing a lot.  Pain on inspiration.  Cardiac enzymes neg. nml BNP.  rec Decrease prednisone 20mg  daily for 1 week then 10mg  daily for 1 week then stop.  Labs today.  Begin Delsym 2 tsp Twice daily  For cough .  Zyrtec 10mg  At bedtime  For drainage .  Decrease Neurontin back to previous dose 100mg  Three times a day      01/19/2015 f/u ov/Cathy Stevenson re: sarcoid/ chronic cough x years/ not clear really which meds she's taking  Chief Complaint  Patient presents with  . Follow-up    Pt states she is feeling no better since the last visit- still c/o chest tightness, cough with white sputum and SOB.  She is using neb 3 x per day on average.  only better while on mobic and prednisone x 5 days /otherwise the same x years   Last neb was  12 h prior to OV   L ant cp x one month, worse with cough or bending down, "different from my sarcoid pain which was on the R" rec Pantoprazole (protonix) 40 mg (nexium 40 mg)   Take  30-60 min before first meal of the day and Pepcid (famotidine)  20 mg one @  bedtime until return to office - this is the best way to tell whether stomach acid is contributing to your problem.   Avoid   foods that you know cause gas (especially boiled eggs/  beans and raw vegetables like spinach and salads)  and citrucel 1 heaping tsp twice daily with a large glass of water.  Pain should improve w/in 2 weeks and if not then consider further GI work up.    For chest pain ok to try mobic 7.5 mg up to twice daily with meals as needed  Restart neurtonin 100 mg four times daily automatically  Return in 4weeks  with all meds > did not do     10/30/2016  Acute extenended f/u ov/Cathy Stevenson re re-establish  cough c/w uacs/ irritable larynx not sarcoid related  Chief Complaint  Patient presents with  . Acute Visit    Pt c/o increased SOB and "burning in chest" since Nov 2017.    sleeps on 5 pillows and cpap from [place] > 5  prior to OV   Walking from bed to BR starts hocking up mucus but white and minimal  Chest burning comes and goes sometimes at hs  Taking ppi at hs/ no pepcid at all  rec Esmeprapzole 40 mg   Take  30-60 min before first meal of the day and Pepcid (famotidine)  20 mg one @  bedtime until return to office - this is the best way to tell whether stomach acid is contributing to your problem.   Gabapentin 100 mg twice breakfast and supper  GERD (REFLUX) For drainage / throat tickle try take CHLORPHENIRAMINE  4 mg - take one every 4 hours as needed - available over the counter- may cause drowsiness so start with just a bedtime dose or two and see how you tolerate it before trying in daytime   See Tammy NP w/in 2 weeks for med calendar   11/13/16 NP ov Refer to Dr. Joya Gaskins at [place] 4mg  1 every 4hr as needed for drainage/trhoat clearing  Begin Delsym 2 tsp every 12hr as needed.  Follow med calendar closely and bring to each visit.   01/29/17  ENT eval Vertell Limber  Dx conductive Hearing loss/ chronic cough  Reported to ent uses symb prn    02/13/2017  f/u ov/Cathy Stevenson re: gabapentin 100 mg bid and nexium bid  And symb prn - no med calendar  Chief Complaint  Patient presents with  . Follow-up    Pt here 2 mo follow up for cough, has productive cough with yellow thick mucus. Pt has SOB,using rescue inhalers as needed. Pt concerned and would like to speak to Dr. Melvyn Novas regarding weight loss.   chronic cough is quite a bit better on gabapentin 100 mg twice daily  Most of her coughing is day > noct assoc with urge to clear throat/ min production of slt yellow  thick mucus  Doe= MMRC2 = can't walk a nl pace on a flat grade s sob but does fine slow and flat eg    No obvious day to day or daytime variability or assoc  mucus plugs or hemoptysis  or cp or chest tightness, subjective wheeze or overt sinus or hb symptoms. No unusual exp hx or h/o childhood pna/ asthma or knowledge of premature birth.  Sleeping ok without nocturnal  or early am exacerbation  of respiratory  c/o's or need for noct saba. Also denies any obvious fluctuation of symptoms with weather or environmental changes or other aggravating or alleviating factors except as outlined above   Current Medications, Allergies, Complete Past Medical History, Past Surgical History, Family History, and Social History were reviewed in [employer] record.  ROS  The following are not active complaints unless bolded sore throat, dysphagia, dental problems, itching, sneezing,  nasal congestion or excess/ purulent secretions, ear ache,   fever, chills, sweats, unintended wt loss, classically pleuritic or exertional cp,  orthopnea pnd or leg swelling, presyncope, palpitations, abdominal pain, anorexia, nausea, vomiting, diarrhea  or change in bowel or bladder habits, change in stools or urine, dysuria,hematuria,  rash, arthralgias, visual complaints, headache, numbness, weakness or ataxia or problems with walking or coordination,  change in mood/affect or memory.                              Objective:   Physical Exam  amb obese bf nad      02/13/2017       227  10/30/2016       226   01/19/15 226 lb (102.513 kg)  12/29/14 227 lb (102.967 kg)  09/30/14 222 lb (100.699 kg)    Vital signs reviewed - Note on arrival 02 sats  98% on RA     HEENT: nl dentition, turbinates, and orophanx. Nl external ear canals without cough reflex   NECK :  without JVD/Nodes/TM/ nl carotid upstrokes bilaterally   LUNGS: no acc muscle use, clear to A and P bilaterally without cough on  insp or exp maneuvers   CV:  RRR  no s3 or murmur or increase in P2, no edema   ABD:  soft and nontender with nl excursion in the supine position. No bruits or organomegaly, bowel sounds nl  MS:  warm without deformities, calf tenderness, cyanosis or clubbing  SKIN: warm and dry without lesions    NEURO:  alert, approp, no deficits      I personally reviewed images and agree with radiology impression as follows:  CXR:   10/02/16 No active cardiopulmonary disease.        Assessment & Plan:   Outpatient Encounter Prescriptions as of 02/13/2017  Medication Sig  . amitriptyline (ELAVIL) 50 MG tablet Take 50 mg by mouth at bedtime.  Marland Kitchen aspirin EC 81 MG tablet Take 81 mg by mouth daily.  . budesonide-formoterol (SYMBICORT) 160-4.5 MCG/ACT inhaler Inhale 2 puffs into the lungs daily as needed.  . clonazePAM (KLONOPIN) 0.5 MG tablet Take 0.5 mg by mouth at bedtime.   Marland Kitchen esomeprazole (NEXIUM) 40 MG capsule Take 40 mg by mouth daily at 12 noon.  . furosemide (LASIX) 40 MG tablet Take 1 tablet (40 mg total) by mouth daily as needed. (Patient taking differently: Take 40 mg by mouth every evening. )  . gabapentin (NEURONTIN) 100 MG capsule One three times daily  . insulin aspart (NOVOLOG) 100 UNIT/ML injection Inject 30 Units into the skin 3 (three) times daily before meals.   . insulin detemir (LEVEMIR) 100 UNIT/ML injection Inject 60 Units into the skin 2 (two) times daily.   . valsartan (DIOVAN) 160 MG tablet  TAKE 1 TABLET BY MOUTH DAILY  . verapamil (CALAN-SR) 180 MG CR tablet Take 180 mg by mouth daily.  . [DISCONTINUED] gabapentin (NEURONTIN) 100 MG capsule Take 1 capsule (100 mg total) by mouth 2 (two) times daily. One three times daily   Facility-Administered Encounter Medications as of 02/13/2017  Medication  . 0.9 %  sodium chloride infusion

## 2017-02-13 NOTE — Patient Instructions (Addendum)
Increase gabapentin to 100 mg three times daily and follow up with [facility] ent as planned - this is a throat medication so should be refilled in future thru your throat doctor  Wt loss will help you more than anything else - good luck!   If you are satisfied with your treatment plan,  let your doctor know and he/she can either refill your medications or you can return here when your prescription runs out.     If in any way you are not 100% satisfied,  please tell us.  If 100% better, tell your friends!  Pulmonary follow up is as needed

## 2017-02-16 NOTE — Assessment & Plan Note (Signed)
-   PFTs 09/30/14  VC 79% pred/ dlco 65 but corrects with alv vol to 101%  No evidence of active dz

## 2017-02-16 NOTE — Assessment & Plan Note (Signed)
Body mass index is 41.63 kg/m.  -  trending up slightly  No results found for: TSH   Contributing to gerd risk/ doe/reviewed the need and the process to achieve and maintain neg calorie balance > defer f/u primary care including intermittently monitoring thyroid status    I advised her that wt loss would do more for her breathing than anything else we have to offer here and I see no reason she could not be cleared for bariatric surgery but would need a preop clearance here just prior to the surgery

## 2017-02-16 NOTE — Assessment & Plan Note (Addendum)
-   trial off acei 08/09/2014 >  Not convinced better 09/30/14 > rec neurontin 300 tid and max gerd rx > could not tol 300 so d/c ? Helped? - rechallenged with neurontin 100 qid 01/19/15 > did not tol - Rechallenged with gabapentin 100 bid and 1st gen h1 10/30/2016 >>> improved 02/13/2017   I had an extended final summary discussion with the patient reviewing all relevant studies completed to date and  lasting 15 to 20 minutes of a 25 minute visit on the following issues:   Reviewed notes from [facility]/ Dr Vertell Limber    She has been consistently inconsistent with pulmonary meds and will not follow written plan that outlines in a user friendly fashion the difference between maint and prn rx and since now establish to f/u the [facility] with [facility] no need to return here unless pulmonary symptoms worsen after first implementing the instructions she already has on hand (which she has failed to do to date)  If she does need to return, advised should be with all meds in hand using a trust but verify approach to confirm accurate Medication  Reconciliation The principal here is that until we are certain that the  patients are doing what we've asked, it makes no sense to ask them to do more.   Each maintenance medication was reviewed in detail including most importantly the difference between maintenance and as needed and under what circumstances the prns are to be used.  Please see AVS for specific  Instructions which are unique to this visit and I personally typed out  which were reviewed in detail in writing with the patient and a copy provided.

## 2017-08-10 ENCOUNTER — Other Ambulatory Visit: Payer: Self-pay | Admitting: Internal Medicine

## 2017-09-09 ENCOUNTER — Other Ambulatory Visit: Payer: Self-pay | Admitting: Internal Medicine

## 2018-12-02 IMAGING — MR MR LUMBAR SPINE W/O CM
4 of 5 series · 25 of 48 positions shown · non-contrast
Comparison: CT scan dated 10/03/2016 and radiographs dated
10/02/2016

CLINICAL DATA: Severe mid to low back pain since 07/12/2016.

EXAM:
MRI LUMBAR SPINE WITHOUT CONTRAST
TECHNIQUE: Multiplanar, multisequence MR imaging of the lumbar spine was
performed. No intravenous contrast was administered.

[Series 3: T1 · sagittal · 4.0mm · 0.84mm/px · 7 of 14 slices shown (1 of 2)]
[im 1/14]
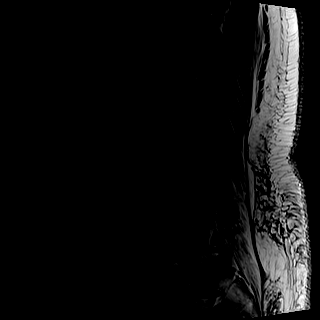
[im 3/14]
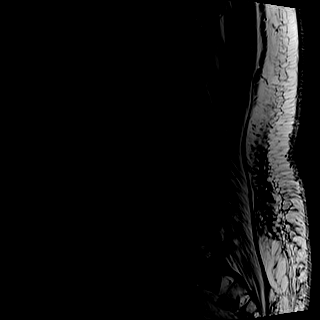
[im 5/14]
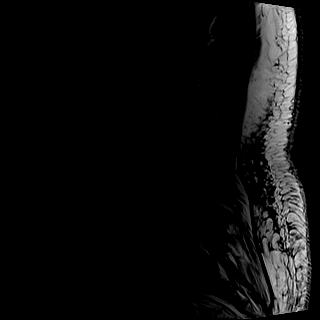
[im 7/14]
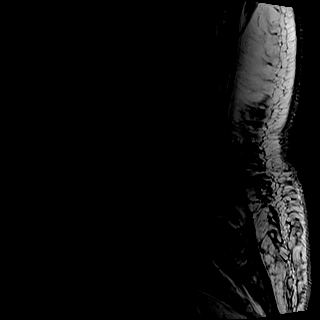
[im 9/14]
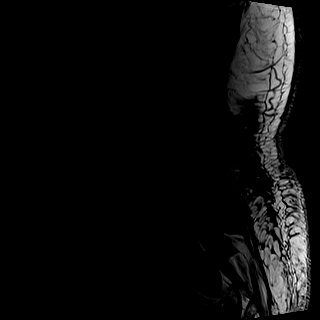
[im 11/14]
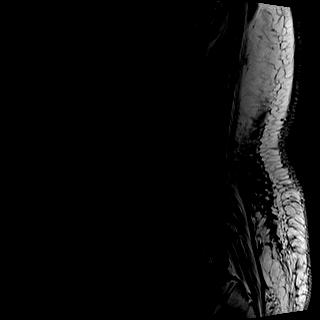
[im 14/14]
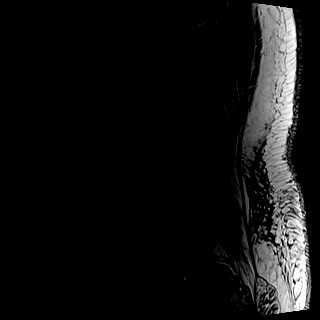

[Series 4: T2 · sagittal · 4.0mm · 0.42mm/px · 7 of 14 slices shown (1 of 2)]
[im 1/14]
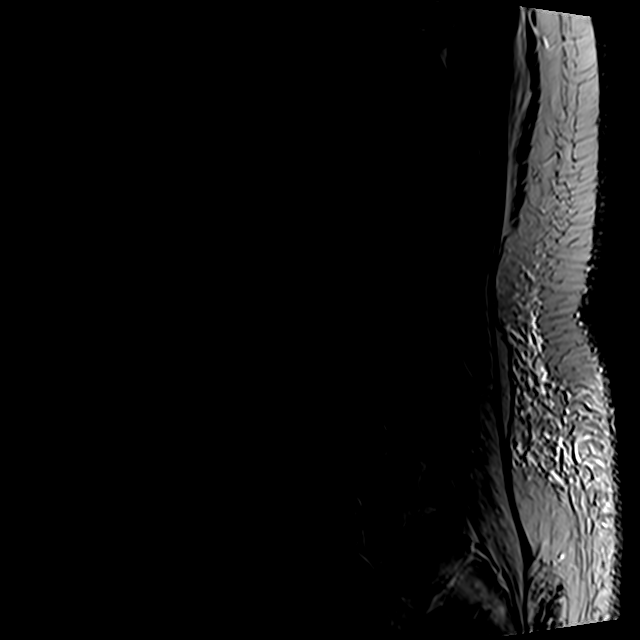
[im 3/14]
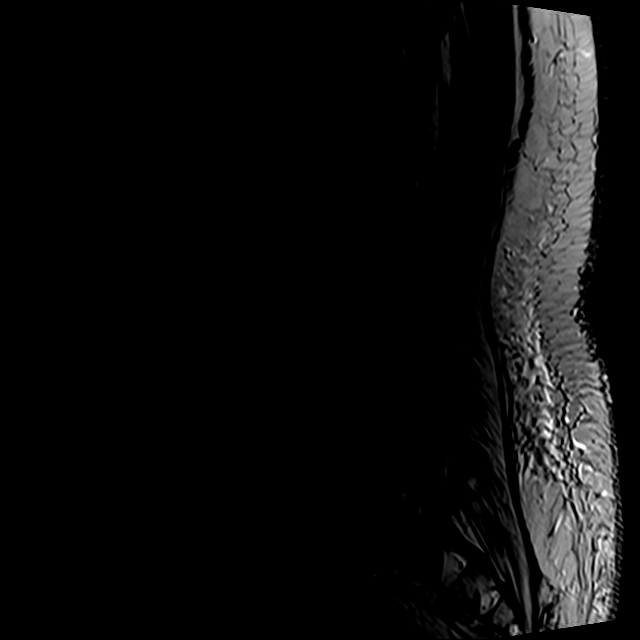
[im 5/14]
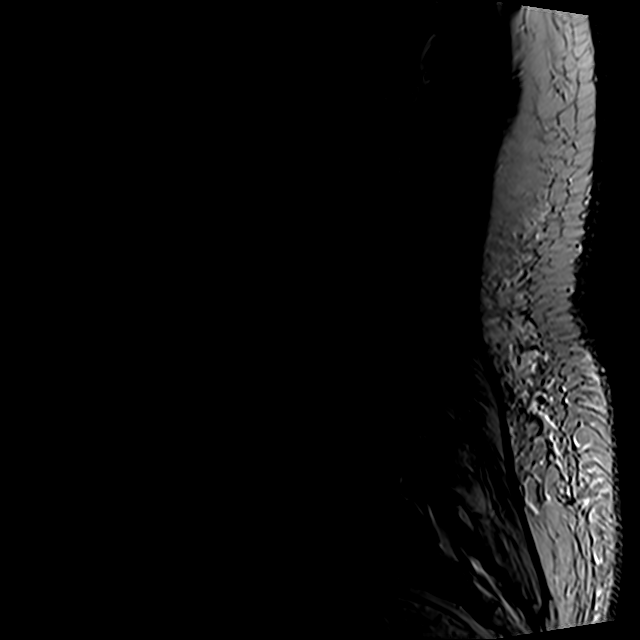
[im 7/14]
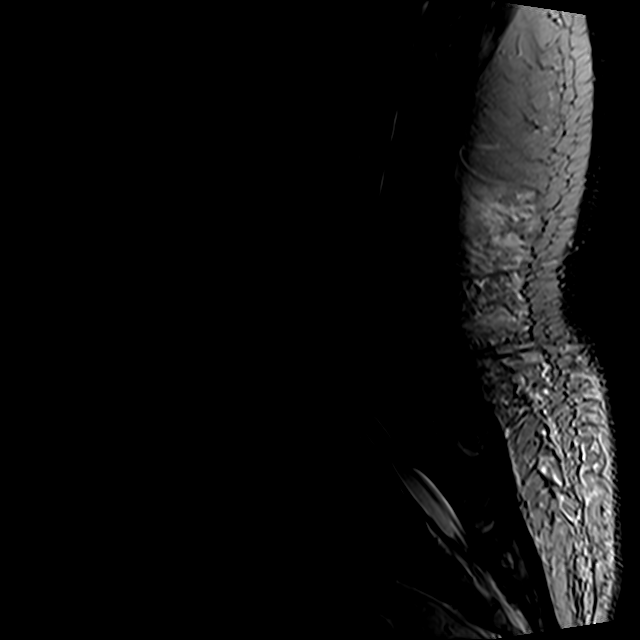
[im 9/14]
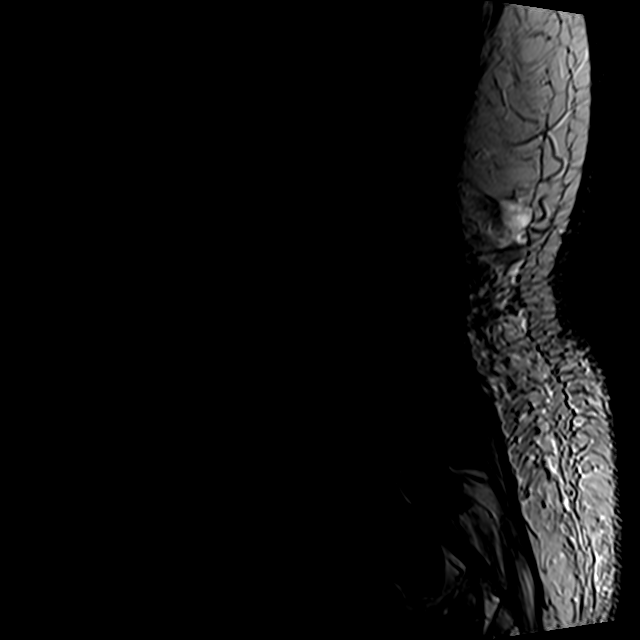
[im 11/14]
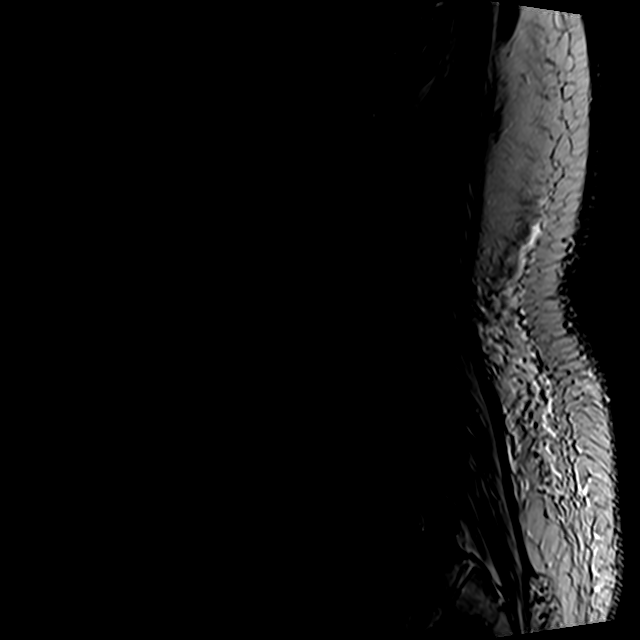
[im 14/14]
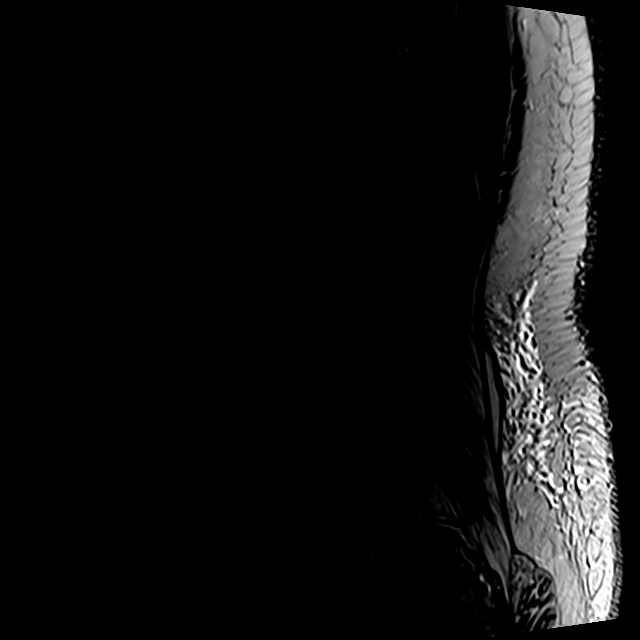

[Series 5: T2 · axial · 4.0mm · 0.70mm/px · z∈[-44,+118]mm · 8 of 31 slices shown (2 of 2)]
[im 1/31]
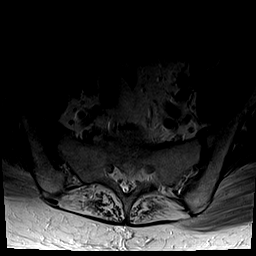
[im 5/31]
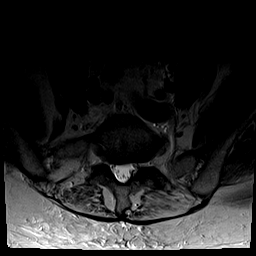
[im 10/31]
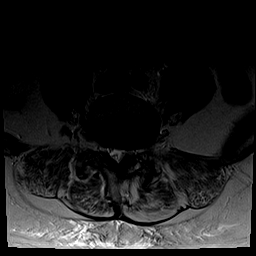
[im 14/31]
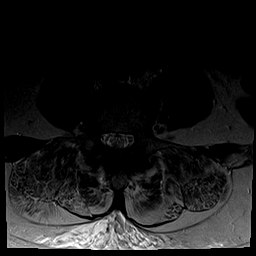
[im 17/31]
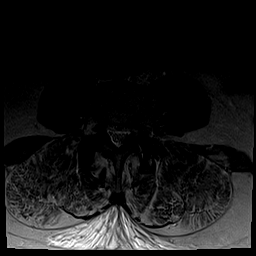
[im 21/31]
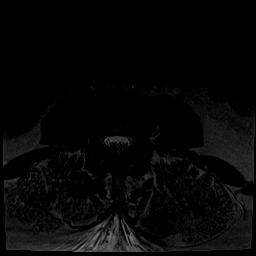
[im 26/31]
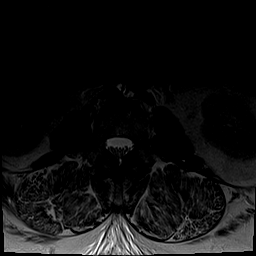
[im 31/31]
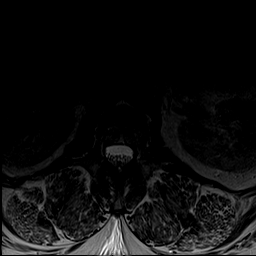

[Series 7: T1 · axial · 4.0mm · 0.35mm/px · z∈[-25,+92]mm · 3 of 31 slices shown (2 of 2)]
[im 5/31]
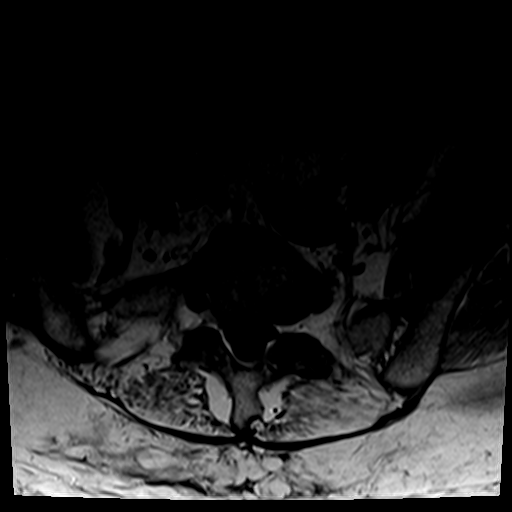
[im 17/31]
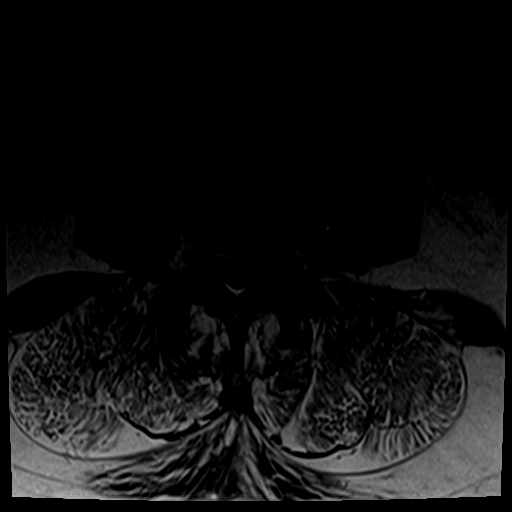
[im 26/31]
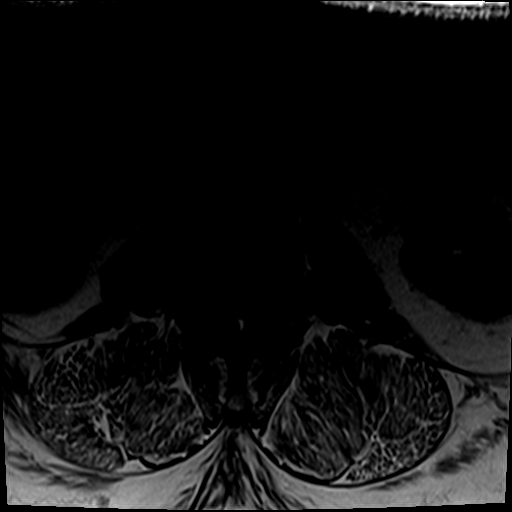

[25 of 48 positions shown; findings below may reference images not displayed]

FINDINGS: Segmentation:  Standard.

Alignment: Grade 1 spondylolisthesis ease of L3 on L4 and of L4 on
L5.

Vertebrae: Severe facet arthritis at L3-4 and L4-5, and auto fusion
at L5-S1.

Conus medullaris: Extends to the L1-2 level and appears normal.

Paraspinal and other soft tissues: Negative.

Disc levels:

L1-2:  Slight disc desiccation.  Otherwise normal.

L2-3: Slight disc desiccation. The disc is otherwise normal. Slight
hypertrophy of the ligamentum flavum and facet joints without neural
impingement.

L3-4: 5 mm spondylolisthesis due to severe bilateral facet
arthritis. Moderately severe spinal stenosis due to hypertrophy of
the ligamentum flavum the spondylolisthesis. No bulging or
protrusion of the uncovered disc. Moderate bilateral foraminal
stenosis.

L4-5: 4 mm spondylolisthesis due to severe bilateral facet arthritis
with hypertrophy of the ligamentum flavum and facet joints with
slight spinal stenosis and compression of the left lateral recess
which might affect the left L5 nerve. Slight moderate right and mild
left foraminal stenosis. No bulging or protrusion of the uncovered
disc.

L5-S1: Tiny disc bulge into the right lateral recess and proximal
right neural foramen without neural impingement. Auto fusion of
L5-S1 on the right.
IMPRESSION: 1. Severe bilateral facet arthritis at L3-4 and L4-5 with grade 1
spondylolisthesis at each level.
2. Moderately severe spinal stenosis at L3-4.
3. No focal disc protrusions or significant disc bulges.

## 2018-12-25 ENCOUNTER — Encounter: Payer: Self-pay | Admitting: Gastroenterology
